# Patient Record
Sex: Female | Born: 1985 | Race: White | Hispanic: No | Marital: Single | Smoking: Current every day smoker
Health system: Southern US, Community
[De-identification: ages and names within clinical notes are randomized; demographics above are authoritative.]

## PROBLEM LIST (undated history)

## (undated) DIAGNOSIS — J939 Pneumothorax, unspecified: Secondary | ICD-10-CM

## (undated) DIAGNOSIS — M5090 Cervical disc disorder, unspecified, unspecified cervical region: Secondary | ICD-10-CM

## (undated) DIAGNOSIS — T7840XA Allergy, unspecified, initial encounter: Secondary | ICD-10-CM

## (undated) DIAGNOSIS — S32009A Unspecified fracture of unspecified lumbar vertebra, initial encounter for closed fracture: Secondary | ICD-10-CM

## (undated) DIAGNOSIS — F988 Other specified behavioral and emotional disorders with onset usually occurring in childhood and adolescence: Secondary | ICD-10-CM

## (undated) HISTORY — DX: Allergy, unspecified, initial encounter: T78.40XA

## (undated) HISTORY — PX: WISDOM TOOTH EXTRACTION: SHX21

## (undated) HISTORY — PX: PLEURAL SCARIFICATION: SHX748

## (undated) HISTORY — DX: Cervical disc disorder, unspecified, unspecified cervical region: M50.90

## (undated) HISTORY — DX: Unspecified fracture of unspecified lumbar vertebra, initial encounter for closed fracture: S32.009A

## (undated) HISTORY — DX: Other specified behavioral and emotional disorders with onset usually occurring in childhood and adolescence: F98.8

## (undated) HISTORY — DX: Pneumothorax, unspecified: J93.9

## (undated) HISTORY — PX: OTHER SURGICAL HISTORY: SHX169

---
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
## 2009-04-29 DIAGNOSIS — J939 Pneumothorax, unspecified: Secondary | ICD-10-CM

## 2009-04-29 HISTORY — PX: PLEURAL SCARIFICATION: SHX748

## 2009-04-29 HISTORY — DX: Pneumothorax, unspecified: J93.9

## 2009-05-25 ENCOUNTER — Ambulatory Visit: Payer: Self-pay | Admitting: Cardiothoracic Surgery

## 2009-05-25 ENCOUNTER — Inpatient Hospital Stay (HOSPITAL_COMMUNITY): Admission: EM | Admit: 2009-05-25 | Discharge: 2009-05-28 | Payer: Self-pay | Admitting: Emergency Medicine

## 2009-06-07 ENCOUNTER — Ambulatory Visit: Payer: Self-pay | Admitting: Cardiothoracic Surgery

## 2009-06-07 ENCOUNTER — Encounter: Admission: RE | Admit: 2009-06-07 | Discharge: 2009-06-07 | Payer: Self-pay | Admitting: Cardiothoracic Surgery

## 2009-06-09 ENCOUNTER — Ambulatory Visit: Payer: Self-pay | Admitting: Cardiothoracic Surgery

## 2009-06-09 ENCOUNTER — Encounter: Admission: RE | Admit: 2009-06-09 | Discharge: 2009-06-09 | Payer: Self-pay | Admitting: Cardiothoracic Surgery

## 2009-06-14 ENCOUNTER — Encounter: Admission: RE | Admit: 2009-06-14 | Discharge: 2009-06-14 | Payer: Self-pay | Admitting: Cardiothoracic Surgery

## 2009-06-14 ENCOUNTER — Ambulatory Visit: Payer: Self-pay | Admitting: Cardiothoracic Surgery

## 2010-09-11 NOTE — Assessment & Plan Note (Signed)
OFFICE VISIT   EZME, DUCH  DOB:  27-Oct-1985                                        June 09, 2009  CHART #:  [id number]   CURRENT PROBLEMS:  1. Spontaneous right pneumothorax (50%), treated with a chest tube.  2. History of smoking.   PRESENT ILLNESS:  The patient is a 25 year old Caucasian female who was  admitted in late January with a first episode of right spontaneous  pneumothorax.  This resolved with a chest tube.  Her first post hospital  office visit chest x-ray showed a 10% right pneumothorax.  She is  asymptomatic.  She still smokes 4-5 cigarettes a day, however.  She  returns now with a chest x-ray for followup of the small recurrent  pneumothorax.   PHYSICAL EXAMINATION:  Blood pressure 120/70, pulse 68, respirations 18,  saturation on room air 99%.  Breath sounds are clear and equal.  There  is no palpable crepitus.  The right chest tube site is healed.  Cardiac  rhythm is regular.   DIAGNOSTIC TESTS:  A PA and lateral chest x-ray shows the pneumothorax  to be decreased to approximately 5%.  There are no other abnormalities  seen on the lung fields.   PLAN:  The patient will return in 5 days with a chest x-ray.  She will  continue to have a low-level activity, and she was advised to completely  stop smoking.   Kerin Perna, M.D.  Electronically Signed   PV/MEDQ  D:  06/09/2009  T:  06/10/2009  Job:  [id number]

## 2010-09-11 NOTE — Assessment & Plan Note (Signed)
OFFICE VISIT   Whitney Little, Whitney Little  DOB:  1986/02/04                                        June 14, 2009  CHART #:  [id number]   CURRENT PROBLEMS:  1. Spontaneous right pneumothorax (50%) treated with chest tube,      May 25, 2009.  2. History of mild smoking.   PRESENT ILLNESS:  The patient is a 25 year old Caucasian female recently  reformed smoker who presents for final followup after having had a chest  tube placed for a right pneumothorax in late January.  Her followup  chest x-rays initially showed almost resolution of the pneumothorax,  then approximately 2 weeks after treatment she developed a small  recurrent 10% right pneumothorax.  This has been followed and treated  expectantly and she returns now with a chest x-ray for further review.   She is asymptomatic except for some mild shortness of breath while she  was shopping.  No chest pain.  The chest tube site is well healed.   PHYSICAL EXAMINATION:  Vital Signs:  Oxygen saturation is 99%, pulse is  74, and blood pressure is 120/70.  Lungs:  Breath sounds are clear  bilaterally.  Cardiac:  Rhythm is regular.   A PA and lateral chest x-ray today shows resolution of the pneumothorax.  There is no obvious bleb disease noted or other pulmonary parenchymal  abnormality.   PLAN:  The patient return as needed.  She understands the risk of a  recurrent right pneumothorax is approximately 1 in 5 and that she should  call our office during office hours,  report to the [facility] ER if she has recurrent symptoms.  She was also  advised on the importance of permanent smoking cessation.  No  prescriptions were provided.   Kerin Perna, M.D.  Electronically Signed   PV/MEDQ  D:  06/14/2009  T:  06/14/2009  Job:  [id number]

## 2010-09-11 NOTE — Assessment & Plan Note (Signed)
OFFICE VISIT   Whitney Little, Whitney Little  DOB:  [birth year]                                        June 07, 2009  CHART #:  [id number]   CURRENT PROBLEMS:  1. Right spontaneous pneumothorax, treated with a chest tube, May 25, 2009.  2. History of smoking.   PRESENT ILLNESS:  The patient is a 25 year old Caucasian female who had  a spontaneous right pneumothorax in late January who was treated with a  chest tube.  Her lung re-expand and the chest tube was removed after  approximately 48 hours.  She has done well at home and has limited her  activities.  She has not done any strenuous lifting or coughing.  She  continues to smoke approximately half a pack a day.  She returns for a  surgical follow up and surveillance x-ray.   PHYSICAL EXAMINATION:  Blood pressure 120/80, pulse 60, respirations 18,  saturation 99%.  The chest tube site is well healed.  Breath sounds are  clear and equal.  Cardiac rhythm is regular.   DIAGNOSTIC TESTS:  PA and lateral chest x-ray today shows a 10% right  pneumothorax.  This is increased from her x-ray prior to discharge from  the hospital.   PLAN:  She has a small recurrent right pneumothorax.  I feel this will  probably gradually resolve.  It has been 9 days since her last x-ray and  she has been asymptomatic.  We will need to continue to follow this with  serial chest x-rays.  I have allowed her to go back to work on a  limited basis to avoid heavy lifting or strenuous activity.  She was  given a prescription for some hydrocodone 5 mg p.r.n. and will return  with a followup chest x-ray on June 09, 2009.   Kerin Perna, M.D.  Electronically Signed   PV/MEDQ  D:  06/07/2009  T:  06/08/2009  Job:  [id number]

## 2010-09-14 NOTE — Op Note (Signed)
[place]  Patient:    Whitney Little, Whitney Little                    MRN: [id number] Proc. Date: 06/20/99 Adm. Date:  [id number] Attending:  Colbert Ewing                           Operative Report  PREOPERATIVE DIAGNOSIS:  Right elbow displaced medial epicondyle fracture. Status post posterior dislocation, right elbow, closed, status post closed reduction.   POSTOPERATIVE DIAGNOSIS:  Right elbow displaced medial epicondyle fracture. Status post posterior dislocation, right elbow, closed, status post closed reduction.  OPERATION PERFORMED:  Right elbow examination under anesthesia followed by open  reduction internal fixation of medial epicondyle fracture utilizing cannulated .5 lag screw x 1.  SURGEON:  Loreta Ave, M.D.  ASSISTANT:  Arlys John D. Petrarca, P.A.-C.  ANESTHESIA:  General.  ESTIMATED BLOOD LOSS:  Minimal.  TOURNIQUET TIME:  40 minutes.  SPECIMENS:  None.  CULTURES:  None.  COMPLICATIONS:  None.  DRESSING:  Soft compressive with long arm splint and bulky padded dressing.  DESCRIPTION OF PROCEDURE:  Patient was brought to the operating room and after adequate anesthesia had been obtained, splint removed from the right arm. Fluoroscopy unit used for guidance.  Persistent hematoma medially but overall swelling better.  Tourniquet applied about the upper aspect of the right arm, prepped and draped in the usual sterile fashion.  I could bring the arm through  reasonable motion.  There was obvious gross medial instability from the medial epicondyle fracture.  Exsanguinated with elevation and Esmarch.  Tourniquet inflated to 200 mmHg.  Curved incision over the medial epicondyle.  Skin and subcutaneous tissues divided.  Large subcutaneous hematoma present, completely evacuated.  Ulnar nerve identified and protected throughout the procedure. Medial epicondylar fragment was brought back up into anatomic position with  good position confirmed with fluoroscopy.  Held in place with a towel clip.  Temporarily pinned with the guide wire for the 3.5 screw which was placed into the distal humerus avoiding the olecranon fossa.  Measured for a 28 mm screw which was predrilled nd then placed with a screw being a lag screw to allow compression.  It was firmly  tightened down which brought the epicondyle fragment into anatomic position. At completion I had nice firm fixation with anatomic position of the fracture clinically and by C-arm.  I could bring the arm and elbow through full motion including full motion of the forearm.  Marked improvement of elbow stability after repair.  No restriction of motion at all.  The wound was irrigated. Subcutaneous tissue closed with Vicryl, skin with nylon.  Margins of the wound injected with  Marcaine.  Prior to closure, the ulnar nerve was once again identified and was n good condition.  After the wound was closed, sterile compressive dressing and long arm splint applied.  Tourniquet inflated.  Anesthesia reversed.  Brought to recovery room.  Tolerated surgery well.  No complications. DD:  06/20/99 TD:  06/20/99 Job: [id number]

## 2010-11-14 IMAGING — CR DG CHEST 2V
2 series · 2 of 2 positions shown · non-contrast
Comparison: 06/07/2009

CLINICAL DATA: Right pneumothorax, follow-up

CHEST - 2 VIEW

[w chest pa]
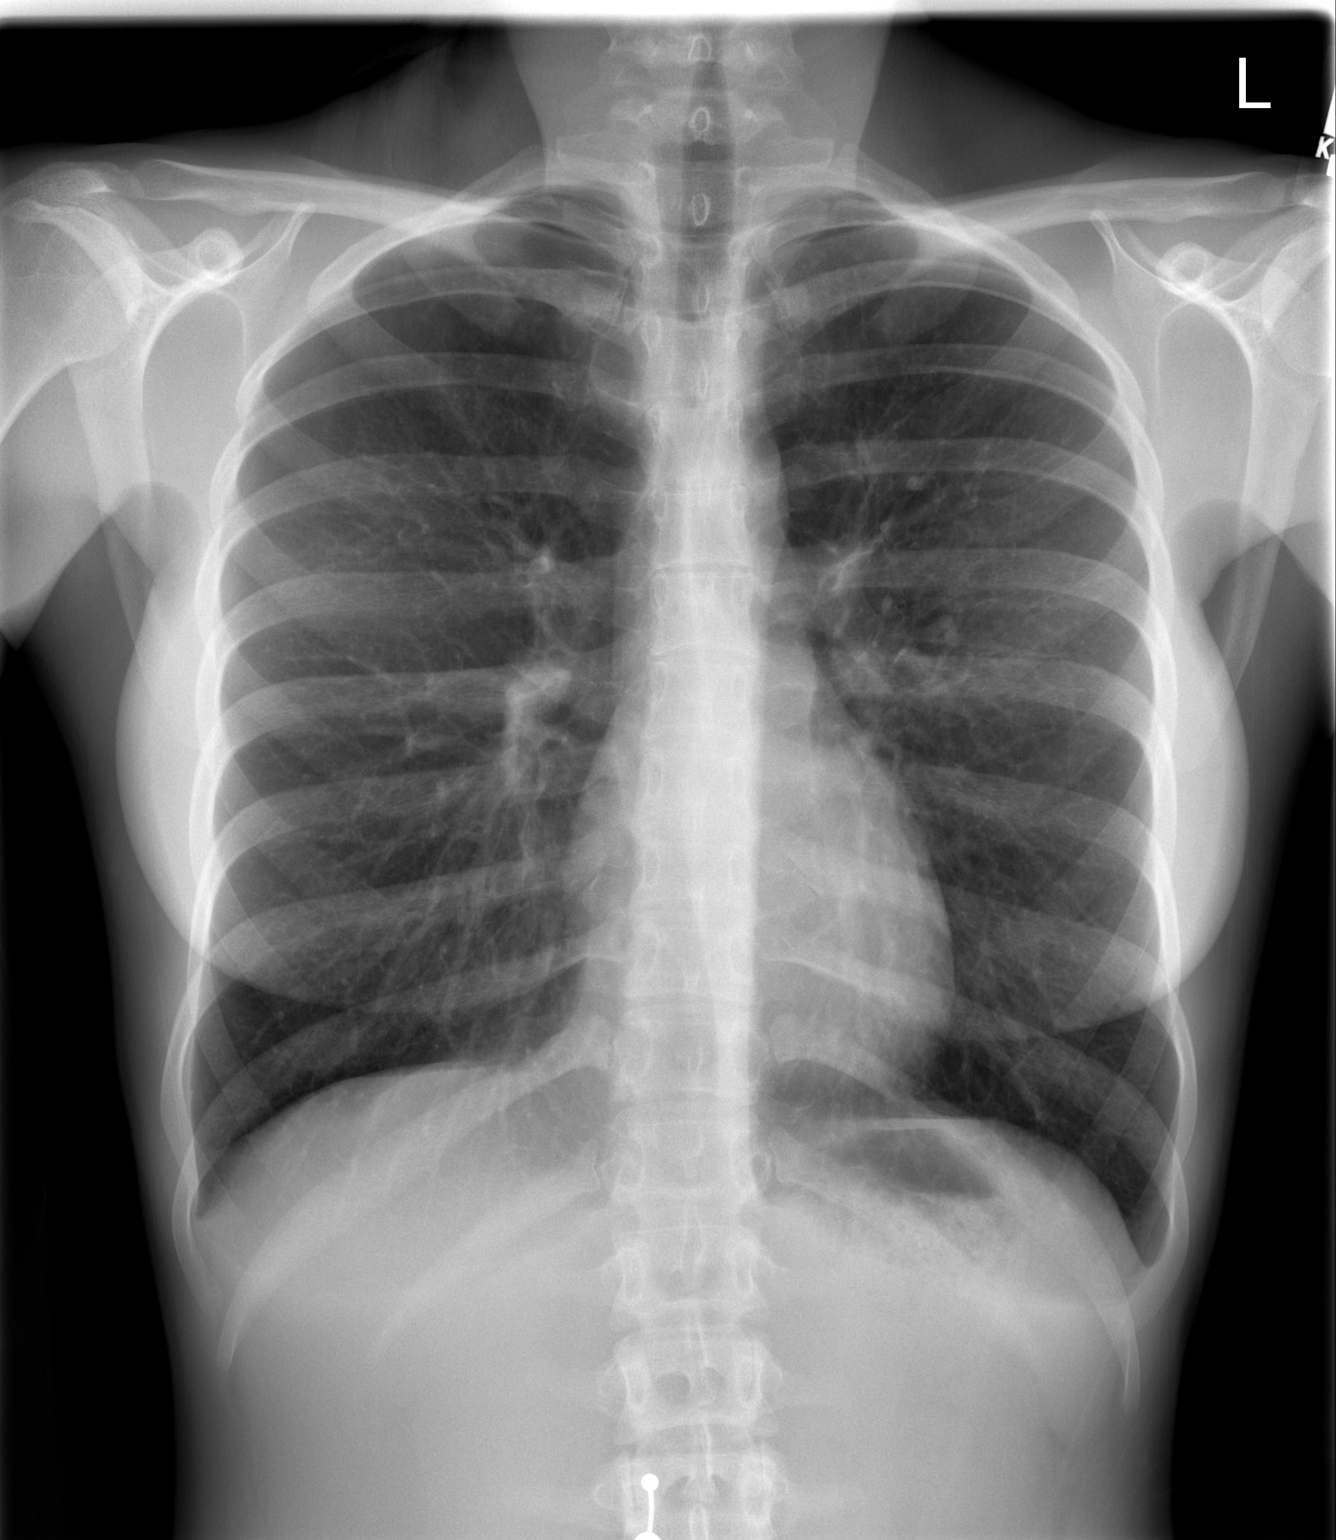

[w chest lat]
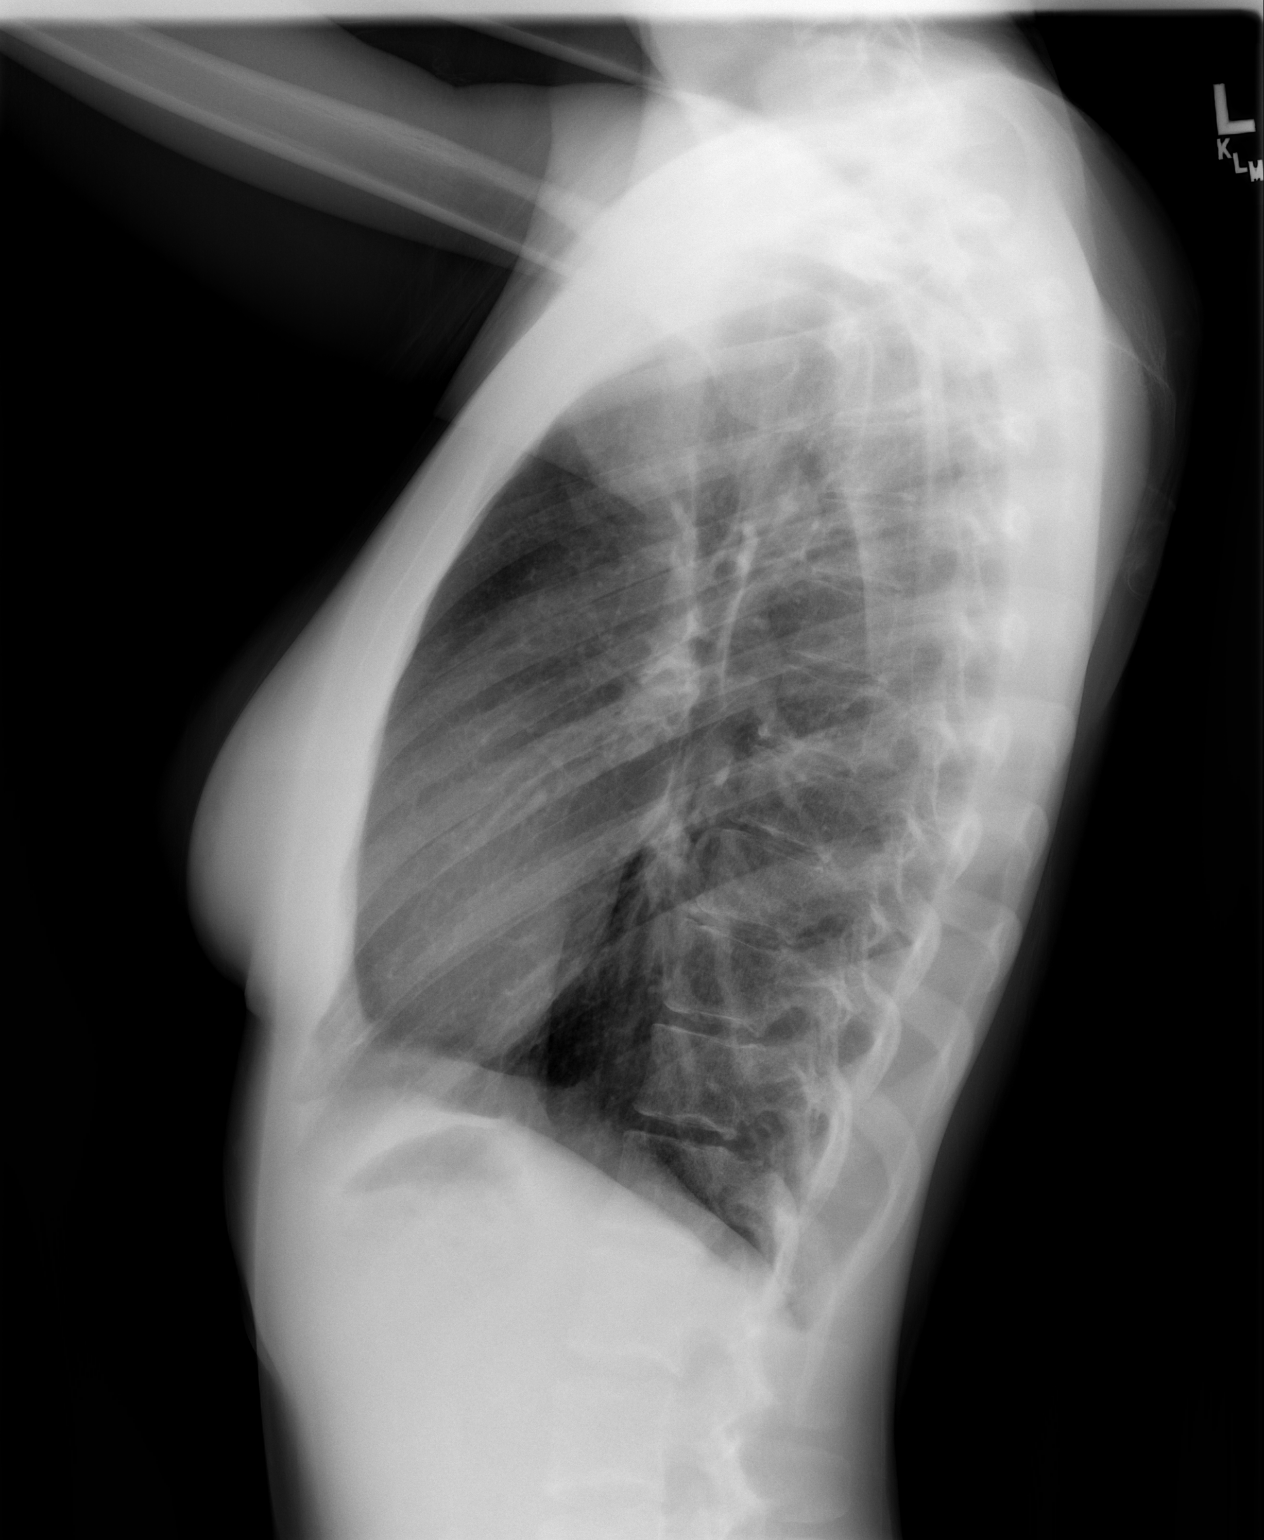

[2 of 2 positions shown; findings below may reference images not displayed]

FINDINGS: Interval improvement in the right apical pneumothorax,
estimated at less than 10%.  Mild bronchial thickening centrally.
Normal heart size and vascularity.  No consolidation or edema.  No
pneumonia.
IMPRESSION: Improving small right apical pneumothorax

## 2011-01-03 NOTE — H&P (Signed)
Whitney Little  DICTATION # [id number] CSN# [id number]   Meriel Pica, MD 01/03/2011 9:41 AM

## 2011-01-04 ENCOUNTER — Encounter (HOSPITAL_COMMUNITY): Payer: Self-pay | Admitting: *Deleted

## 2011-01-07 ENCOUNTER — Encounter (HOSPITAL_COMMUNITY): Payer: Self-pay | Admitting: *Deleted

## 2011-01-07 ENCOUNTER — Encounter (HOSPITAL_COMMUNITY)
Admission: RE | Disposition: A | Discharge status: Home / Self Care | Payer: Self-pay | Source: Ambulatory Visit | Attending: Obstetrics and Gynecology

## 2011-01-07 ENCOUNTER — Ambulatory Visit (HOSPITAL_COMMUNITY): Payer: PRIVATE HEALTH INSURANCE | Admitting: Anesthesiology

## 2011-01-07 ENCOUNTER — Other Ambulatory Visit: Payer: Self-pay | Admitting: Obstetrics and Gynecology

## 2011-01-07 ENCOUNTER — Encounter (HOSPITAL_COMMUNITY): Payer: Self-pay | Admitting: Anesthesiology

## 2011-01-07 ENCOUNTER — Ambulatory Visit (HOSPITAL_COMMUNITY)
Admission: RE | Admit: 2011-01-07 | Discharge: 2011-01-07 | Disposition: A | Discharge status: Home / Self Care | Payer: PRIVATE HEALTH INSURANCE | Source: Ambulatory Visit | Attending: Obstetrics and Gynecology | Admitting: Obstetrics and Gynecology

## 2011-01-07 DIAGNOSIS — O021 Missed abortion: Secondary | ICD-10-CM | POA: Insufficient documentation

## 2011-01-07 HISTORY — PX: DILATION AND EVACUATION: SHX1459

## 2011-01-07 LAB — CBC
MCH: 32.1 pg (ref 26.0–34.0)
MCHC: 34.3 g/dL (ref 30.0–36.0)
Platelets: 238 10*3/uL (ref 150–400)

## 2011-01-07 LAB — ABO/RH: ABO/RH(D): A POS

## 2011-01-07 SURGERY — DILATION AND EVACUATION, UTERUS
Anesthesia: Monitor Anesthesia Care | Site: Vagina | Wound class: Clean Contaminated

## 2011-01-07 MED ORDER — MIDAZOLAM HCL 5 MG/5ML IJ SOLN
INTRAMUSCULAR | Status: DC | PRN
  Administered 2011-01-07: 2 mg via INTRAVENOUS

## 2011-01-07 MED ORDER — DEXAMETHASONE SODIUM PHOSPHATE 4 MG/ML IJ SOLN
INTRAMUSCULAR | Status: DC | PRN
  Administered 2011-01-07: 10 mg via INTRAVENOUS

## 2011-01-07 MED ORDER — CEFAZOLIN SODIUM 1-5 GM-% IV SOLN
1.0000 g | INTRAVENOUS | Status: AC
  Administered 2011-01-07: 1 g via INTRAVENOUS

## 2011-01-07 MED ORDER — PROPOFOL 10 MG/ML IV EMUL
INTRAVENOUS | Status: DC | PRN
  Administered 2011-01-07: 80 mg via INTRAVENOUS
  Administered 2011-01-07: 50 mg via INTRAVENOUS
  Administered 2011-01-07: 30 mg via INTRAVENOUS

## 2011-01-07 MED ORDER — FENTANYL CITRATE 0.05 MG/ML IJ SOLN
25.0000 ug | INTRAMUSCULAR | Status: DC | PRN
  Administered 2011-01-07: 50 ug via INTRAVENOUS

## 2011-01-07 MED ORDER — FENTANYL CITRATE 0.05 MG/ML IJ SOLN
INTRAMUSCULAR | Status: AC
Start: 2011-01-07 — End: 2011-01-07
  Filled 2011-01-07: qty 2

## 2011-01-07 MED ORDER — DEXAMETHASONE SODIUM PHOSPHATE 10 MG/ML IJ SOLN
INTRAMUSCULAR | Status: AC
  Filled 2011-01-07: qty 1

## 2011-01-07 MED ORDER — PROPOFOL 10 MG/ML IV EMUL
INTRAVENOUS | Status: AC
  Filled 2011-01-07: qty 20

## 2011-01-07 MED ORDER — SCOPOLAMINE 1 MG/3DAYS TD PT72
MEDICATED_PATCH | TRANSDERMAL | Status: AC
  Filled 2011-01-07: qty 1

## 2011-01-07 MED ORDER — FENTANYL CITRATE 0.05 MG/ML IJ SOLN
INTRAMUSCULAR | Status: AC
  Filled 2011-01-07: qty 2

## 2011-01-07 MED ORDER — ONDANSETRON HCL 4 MG/2ML IJ SOLN
INTRAMUSCULAR | Status: DC | PRN
  Administered 2011-01-07: 4 mg via INTRAVENOUS

## 2011-01-07 MED ORDER — KETOROLAC TROMETHAMINE 30 MG/ML IJ SOLN: 15.0000 mg | Freq: Once | INTRAMUSCULAR | Status: DC | PRN

## 2011-01-07 MED ORDER — LACTATED RINGERS IV SOLN
INTRAVENOUS | Status: DC
Start: 2011-01-07 — End: 2011-01-07
  Administered 2011-01-07 (×2): via INTRAVENOUS

## 2011-01-07 MED ORDER — LIDOCAINE HCL (CARDIAC) 20 MG/ML IV SOLN
INTRAVENOUS | Status: DC | PRN
  Administered 2011-01-07: 60 mg via INTRAVENOUS

## 2011-01-07 MED ORDER — LIDOCAINE HCL 1 % IJ SOLN
INTRAMUSCULAR | Status: DC | PRN
  Administered 2011-01-07: 8 mL

## 2011-01-07 MED ORDER — KETOROLAC TROMETHAMINE 30 MG/ML IJ SOLN
INTRAMUSCULAR | Status: AC
  Filled 2011-01-07: qty 1

## 2011-01-07 MED ORDER — LIDOCAINE HCL (CARDIAC) 20 MG/ML IV SOLN
INTRAVENOUS | Status: AC
  Filled 2011-01-07: qty 5

## 2011-01-07 MED ORDER — MIDAZOLAM HCL 2 MG/2ML IJ SOLN
INTRAMUSCULAR | Status: AC
  Filled 2011-01-07: qty 2

## 2011-01-07 MED ORDER — SCOPOLAMINE 1 MG/3DAYS TD PT72
1.0000 | MEDICATED_PATCH | Freq: Once | TRANSDERMAL | Status: DC
  Administered 2011-01-07: 1.5 mg via TRANSDERMAL

## 2011-01-07 MED ORDER — CEFAZOLIN SODIUM 1-5 GM-% IV SOLN
INTRAVENOUS | Status: AC
  Filled 2011-01-07: qty 50

## 2011-01-07 MED ORDER — ONDANSETRON HCL 4 MG/2ML IJ SOLN
INTRAMUSCULAR | Status: AC
  Filled 2011-01-07: qty 2

## 2011-01-07 MED ORDER — FENTANYL CITRATE 0.05 MG/ML IJ SOLN
INTRAMUSCULAR | Status: DC | PRN
  Administered 2011-01-07: 100 ug via INTRAVENOUS
  Administered 2011-01-07: 50 ug via INTRAVENOUS

## 2011-01-07 MED ORDER — OXYCODONE-ACETAMINOPHEN 5-325 MG PO TABS: 1.0000 | ORAL_TABLET | ORAL | Status: AC | PRN

## 2011-01-07 SURGICAL SUPPLY — 17 items
CATH ROBINSON RED A/P 16FR (CATHETERS) ×2 IMPLANT
CLOTH BEACON ORANGE TIMEOUT ST (SAFETY) ×2 IMPLANT
DECANTER SPIKE VIAL GLASS SM (MISCELLANEOUS) ×2 IMPLANT
DRAPE UTILITY XL STRL (DRAPES) ×2 IMPLANT
GLOVE BIO SURGEON STRL SZ7 (GLOVE) ×4 IMPLANT
KIT BERKELEY 1ST TRIMESTER 3/8 (MISCELLANEOUS) ×2 IMPLANT
NEEDLE SPNL 22GX3.5 QUINCKE BK (NEEDLE) ×2 IMPLANT
NS IRRIG 1000ML POUR BTL (IV SOLUTION) ×2 IMPLANT
PACK VAGINAL MINOR WOMEN LF (CUSTOM PROCEDURE TRAY) ×2 IMPLANT
PAD PREP 24X48 CUFFED NSTRL (MISCELLANEOUS) ×2 IMPLANT
SET BERKELEY SUCTION TUBING (SUCTIONS) ×2 IMPLANT
SYR CONTROL 10ML LL (SYRINGE) ×2 IMPLANT
TOWEL OR 17X24 6PK STRL BLUE (TOWEL DISPOSABLE) ×4 IMPLANT
VACURETTE 10 RIGID CVD (CANNULA) IMPLANT
VACURETTE 7MM CVD STRL WRAP (CANNULA) ×2 IMPLANT
VACURETTE 8 RIGID CVD (CANNULA) IMPLANT
VACURETTE 9 RIGID CVD (CANNULA) IMPLANT

## 2011-01-07 NOTE — Op Note (Signed)
Preoperative diagnosis: Missed AB Postoperative diagnosis: Same  Procedure: Dilatation and evacuation  Surgeon: Marcelle Overlie  EBL: Minimal  Specimens removed: Products of conception, to pathology.  Procedure and findings:  Patient to the operative room after an adequate level of MAC was obtained with the patient legs in stirrups the perineum and vagina were prepped and draped in the usual manner for D&E, the bladder was drained he UA carried out uterus was 7-8 weeks size mid position adnexa negative. Speculum was positioned, cervix grasped with tenaculum paracervical block was then created infiltrating at 3 and 9:00 submucosally, 7 cc of 1% plain Xylocaine at each site after negative aspiration. The uterus was then sounded to 8 cm, progressively dilated to a 27 Pratt dilator a 7 curved curet was then used to curette a moderate amount of tissue. When no further tissue could be removed a medium blunt curette was used to explore the cavity revealing the walls to be clean minimal bleeding. She tolerated this well went to recovery in good condition. She did receive Toradol IV at the end of the procedure. Dictated by [facility] [employer], not proofread [name]. Whitney Little.D.

## 2011-01-07 NOTE — Anesthesia Postprocedure Evaluation (Signed)
Anesthesia Post Note  Patient: Whitney Little  Procedure(s) Performed:  DILATATION AND EVACUATION (D&E)  Anesthesia type: MAC  Patient location: PACU  Post pain: Pain level controlled  Post assessment: Post-op Vital signs reviewed  Last Vitals:  Filed Vitals:   01/07/11 1448  BP:   Pulse: 77  Temp: 98.2 F (36.8 C)  Resp: 20    Post vital signs: Reviewed  Level of consciousness: sedated  Complications: No apparent anesthesia complications

## 2011-01-07 NOTE — Transfer of Care (Signed)
Immediate Anesthesia Transfer of Care Note  Patient: Whitney Little  Procedure(s) Performed:  DILATATION AND EVACUATION (D&E)  Patient Location: PACU  Anesthesia Type: MAC  Level of Consciousness: awake, alert  and oriented  Airway & Oxygen Therapy: Patient Spontanous Breathing and Patient connected to nasal cannula oxygen  Post-op Assessment: Report given to PACU RN and Post -op Vital signs reviewed and stable  Post vital signs: Reviewed and stable  Complications: No apparent anesthesia complications

## 2011-01-07 NOTE — Anesthesia Procedure Notes (Signed)
Procedure Name: MAC Performed by: Carlyle Lipa Pre-anesthesia Checklist: Patient identified, Patient being monitored, Suction available and Timeout performed Patient Re-evaluated:Patient Re-evaluated prior to inductionOxygen Delivery Method: Simple face mask Preoxygenation: Pre-oxygenation with 100% oxygen Intubation Type: IV induction Dental Injury: Teeth and Oropharynx as per pre-operative assessment

## 2011-01-07 NOTE — Anesthesia Preprocedure Evaluation (Signed)
Anesthesia Evaluation  Name, MR# and DOB Patient awake  General Assessment Comment  Reviewed: Allergy & Precautions, H&P , NPO status , Patient's Chart, lab work & pertinent test results, reviewed documented beta blocker date and time   History of Anesthesia Complications (+) PONV  Airway Mallampati: I TM Distance: >3 FB Neck ROM: full    Dental  (+) Teeth Intact   Pulmonary  clear to auscultation  breath sounds clear to auscultation none    Cardiovascular regular Normal+ Systolic murmurs    Neuro/Psych Negative Neurological ROS  Negative Psych ROS  GI/Hepatic/Renal negative GI ROS  negative Liver ROS  negative Renal ROS        Endo/Other  Negative Endocrine ROS (+)      Abdominal   Musculoskeletal   Hematology negative hematology ROS (+)   Peds  Reproductive/Obstetrics (+) Pregnancy (missed ab)    Anesthesia Other Findings             Anesthesia Physical Anesthesia Plan  ASA: II  Anesthesia Plan: MAC   Post-op Pain Management:    Induction:   Airway Management Planned:   Additional Equipment:   Intra-op Plan:   Post-operative Plan:   Informed Consent: I have reviewed the patients History and Physical, chart, labs and discussed the procedure including the risks, benefits and alternatives for the proposed anesthesia with the patient or authorized representative who has indicated his/her understanding and acceptance.   Dental Advisory Given  Plan Discussed with: CRNA and Surgeon  Anesthesia Plan Comments:         Anesthesia Quick Evaluation

## 2011-01-15 ENCOUNTER — Encounter (HOSPITAL_COMMUNITY): Payer: Self-pay | Admitting: Obstetrics and Gynecology

## 2011-01-28 DEATH — deceased

## 2011-04-14 ENCOUNTER — Ambulatory Visit: Payer: Self-pay

## 2011-04-14 DIAGNOSIS — M62838 Other muscle spasm: Secondary | ICD-10-CM

## 2011-04-14 DIAGNOSIS — M25519 Pain in unspecified shoulder: Secondary | ICD-10-CM

## 2011-04-14 DIAGNOSIS — M542 Cervicalgia: Secondary | ICD-10-CM

## 2011-11-28 ENCOUNTER — Ambulatory Visit (INDEPENDENT_AMBULATORY_CARE_PROVIDER_SITE_OTHER): Payer: BC Managed Care – PPO | Admitting: Emergency Medicine

## 2011-11-28 VITALS — BP 92/70 | HR 77 | Temp 98.1°F | Resp 16 | Ht 64.5 in | Wt 100.0 lb

## 2011-11-28 DIAGNOSIS — L980 Pyogenic granuloma: Secondary | ICD-10-CM

## 2011-11-28 DIAGNOSIS — F988 Other specified behavioral and emotional disorders with onset usually occurring in childhood and adolescence: Secondary | ICD-10-CM

## 2011-11-28 DIAGNOSIS — IMO0002 Reserved for concepts with insufficient information to code with codable children: Secondary | ICD-10-CM

## 2011-11-28 DIAGNOSIS — M79609 Pain in unspecified limb: Secondary | ICD-10-CM

## 2011-11-28 MED ORDER — AMPHETAMINE-DEXTROAMPHETAMINE 20 MG PO TABS: 20.0000 mg | ORAL_TABLET | Freq: Two times a day (BID) | ORAL | Status: DC

## 2011-11-28 MED ORDER — TRAMADOL HCL 50 MG PO TABS: 50.0000 mg | ORAL_TABLET | Freq: Three times a day (TID) | ORAL | Status: AC | PRN

## 2011-11-28 MED ORDER — CEPHALEXIN 500 MG PO TABS: 500.0000 mg | ORAL_TABLET | Freq: Four times a day (QID) | ORAL | Status: DC

## 2011-11-28 MED ORDER — CEPHALEXIN 500 MG PO TABS: 500.0000 mg | ORAL_TABLET | Freq: Three times a day (TID) | ORAL | Status: AC

## 2011-11-28 NOTE — Progress Notes (Signed)
Patient ID: Whitney Little MRN: [id number], DOB: [birth year], 26 y.o. Date of Encounter: 11/28/2011, 10:05 AM  Patient also requesting refill of Adderall.  Last OV was in March 2012 due to she has not had health insurance.  States she has been on Adderall since Kindergarten and is controlled at current dose.   PROCEDURE NOTE: Verbal consent obtained. Sterile technique employed. Numbing: Digital block with 2% lidocaine plain. Betadine prep.  Pyogenic granuloma removed with curette.   Hemostasis obtained. Wound cleansed and dressed.  Wound care instructions including precautions covered with patient. Handout given.    Rhoderick Moody, PA-C 11/28/2011 10:05 AM

## 2011-11-28 NOTE — Patient Instructions (Signed)
WOUND CARE  . Keep area clean and dry for 24 hours. Do not remove bandage, if applied. . After 24 hours, remove bandage and wash wound gently with mild soap and warm water. Reapply a new bandage after cleaning wound, if directed. . Continue daily cleansing with soap and water until stitches/staples are removed. . Do not apply any ointments or creams to the wound while stitches/staples are in place, as this may cause delayed healing. . Notify the office if you experience any of the following signs of infection: Swelling, redness, pus drainage, streaking, fever >101.0 F . Notify the office if you experience excessive bleeding that does not stop after 15-20 minutes of constant, firm pressure.   

## 2011-11-28 NOTE — Progress Notes (Signed)
Patient presents with cut from two weeks ago. Been putting peroxide and neosporin on left thumb.  Cut  cuticle on right thumb.  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  Had a tdap last year.  She has developed a growth at base of the nail.

## 2011-11-28 NOTE — Progress Notes (Signed)
  Subjective:    Patient ID: Whitney Little, female    DOB: 07/08/1985, 26 y.o.   MRN: [id number]  HPI  please see note as dictated by Valetta Mole    Review of Systems patient in good health she is on oral contraceptive pills      Objective:   Physical Exam examination of her right thigh reveals an area of granulation tissue which is 6 x 8 mm involving the corner of the nail. The tissue around the area of granuloma is also macerated with whitish skin        Assessment & Plan:  We'll go ahead and number some removed the area of granulation tissue and treat with cephalexin 500 twice a day for one week.

## 2012-03-30 ENCOUNTER — Ambulatory Visit (INDEPENDENT_AMBULATORY_CARE_PROVIDER_SITE_OTHER): Payer: BC Managed Care – PPO | Admitting: Family Medicine

## 2012-03-30 VITALS — BP 112/66 | HR 81 | Temp 97.9°F | Resp 16 | Ht 63.5 in | Wt 107.0 lb

## 2012-03-30 DIAGNOSIS — F988 Other specified behavioral and emotional disorders with onset usually occurring in childhood and adolescence: Secondary | ICD-10-CM

## 2012-03-30 DIAGNOSIS — F909 Attention-deficit hyperactivity disorder, unspecified type: Secondary | ICD-10-CM

## 2012-03-30 DIAGNOSIS — R61 Generalized hyperhidrosis: Secondary | ICD-10-CM

## 2012-03-30 DIAGNOSIS — N926 Irregular menstruation, unspecified: Secondary | ICD-10-CM

## 2012-03-30 DIAGNOSIS — G4700 Insomnia, unspecified: Secondary | ICD-10-CM

## 2012-03-30 LAB — POCT CBC
Granulocyte percent: 65.4 %G (ref 37–80)
HCT, POC: 43.2 % (ref 37.7–47.9)
MCV: 99.2 fL — AB (ref 80–97)
MID (cbc): 0.7 (ref 0–0.9)
Platelet Count, POC: 359 10*3/uL (ref 142–424)
RBC: 4.35 M/uL (ref 4.04–5.48)

## 2012-03-30 LAB — POCT URINE PREGNANCY: Preg Test, Ur: NEGATIVE

## 2012-03-30 MED ORDER — AMPHETAMINE-DEXTROAMPHETAMINE 20 MG PO TABS: 20.0000 mg | ORAL_TABLET | Freq: Two times a day (BID) | ORAL | Status: DC

## 2012-03-30 NOTE — Progress Notes (Signed)
[facility] [address] [place] [address] [phone]  Date:  03/30/2012   Name:  Whitney Little   DOB:  May 29, 1985   MRN:  [id number]  PCP:  No primary provider on file.    Chief Complaint: Insomnia, Medication Refill and menstrul issues   History of Present Illness:  Whitney Little is a 26 y.o. very pleasant female patient who presents with the following:  She is here today with a few different issues.   Whitney Little has been bothered by insomnia for some time. She is able to get to sleep, but then she will wake up during the night with "hot flashes" and have a hard time getting back to sleep. She has noted this problem for over 6 months.  She wakes up at 2 or 3 am and sometimes cannot get back to sleep at all.    She has tried unisom and other OTC sleep aids - they have not seemed to help  She needs a refill of her adderall.  She takes it once or twice a day.  Just taking one pill does not improve her sleep- there are days where she takes none and this also does not help.  She tries to never take it after 5 pm.    She has been stable on her adderall since she was a child.   [facility]   She also came off her OCP about 6 months ago.  She is not sure if her sleeping problems predate stopping her OCP or not.    Last month she had "3 periods."  She noted heavy bleeding and bleeding durationsof 7 days.  She was fairly regular after she stopped her pills prior to this past month. However, she went on OCP in the first place due to heavy and irregular menses.    He last pap was in march 2012.   Last refill of adderall in August- she does not use it every day.    Her mother has a history of hypothyroidism.    She and her BF are actively trying to conceive.  She is on prenatal vitamins but is still smoking. She has cut back to a 1/2 PPD   Patient Active Problem List  Diagnosis  . ADD (attention  deficit disorder)    Past Medical History  Diagnosis Date  . ADD (attention deficit disorder)     Past Surgical History  Procedure Date  . Right arm   . Pleural scarification   . Right foot surgery   . Wisdom tooth extraction   . Dilation and evacuation 01/07/2011    Procedure: DILATATION AND EVACUATION (D&E);  Surgeon: Meriel Pica;  Location: [facility];  Service: Gynecology;  Laterality: N/A;    History  Substance Use Topics  . Smoking status: Current Every Day Smoker -- 0.5 packs/day for 9 years    Types: Cigarettes  . Smokeless tobacco: Never Used  . Alcohol Use: No    Family History  Problem Relation Age of Onset  . Osteoporosis    . Hypertension    . Heart disease    . Thyroid disease    . Hypertension Father     No Known Allergies  Medication list has been reviewed and updated.  Current Outpatient Prescriptions on File Prior to Visit  Medication Sig Dispense Refill  . amphetamine-dextroamphetamine (ADDERALL) 20 MG tablet Take 1 tablet (20 mg total) by  mouth 2 (two) times daily.  60 tablet  0    Review of Systems:  As per HPI- otherwise negative.   Physical Examination: Filed Vitals:   03/30/12 1244  BP: 112/66  Pulse: 81  Temp: 97.9 F (36.6 C)  Resp: 16   Filed Vitals:   03/30/12 1244  Height: 5' 3.5" (1.613 m)  Weight: 107 lb (48.535 kg)   Body mass index is 18.66 kg/(m^2). Ideal Body Weight: Weight in (lb) to have BMI = 25: 143.1   GEN: WDWN, NAD, Non-toxic, A & O x 3 HEENT: Atraumatic, Normocephalic. Neck supple. No masses, No LAD. Ears and Nose: No external deformity. CV: RRR, No M/G/R. No JVD. No thrill. No extra heart sounds. PULM: CTA B, no wheezes, crackles, rhonchi. No retractions. No resp. distress. No accessory muscle use. ABD: S, NT, ND. No rebound. No HSM. EXTR: No c/c/e NEURO Normal gait.  PSYCH: Normally interactive. Conversant. Not depressed or anxious appearing.  Calm demeanor.  GU: normal exam.  No lesions, no  discharge, cervix visually normal, no adnexal masses or tenderness   Results for orders placed in visit on 03/30/12  POCT CBC      Component Value Range   WBC 10.5 (*) 4.6 - 10.2 K/uL   Lymph, poc 3.0  0.6 - 3.4   POC LYMPH PERCENT 28.4  10 - 50 %L   MID (cbc) 0.7  0 - 0.9   POC MID % 6.2  0 - 12 %M   POC Granulocyte 6.9  2 - 6.9   Granulocyte percent 65.4  37 - 80 %G   RBC 4.35  4.04 - 5.48 M/uL   Hemoglobin 13.2  12.2 - 16.2 g/dL   HCT, POC 40.9  81.1 - 47.9 %   MCV 99.2 (*) 80 - 97 fL   MCH, POC 30.3  27 - 31.2 pg   MCHC 30.6 (*) 31.8 - 35.4 g/dL   RDW, POC 91.4     Platelet Count, POC 359  142 - 424 K/uL   MPV 10.1  0 - 99.8 fL  POCT URINE PREGNANCY      Component Value Range   Preg Test, Ur Negative      Assessment and Plan: 1. Insomnia    2. Night sweats  POCT CBC, TSH  3. Menstrual irregularity  POCT urine pregnancy  4. ADHD (attention deficit hyperactivity disorder)    5. ADD (attention deficit disorder)  amphetamine-dextroamphetamine (ADDERALL) 20 MG tablet, amphetamine-dextroamphetamine (ADDERALL) 20 MG tablet   Whitney Little is here with several issues today.  It is possible that her night sweats and menstrual irregularities are related.  Will check a TSH.  If this is normal plan to refer her to OBG for further evaluation.  She states that she is not concerned about STI as she has been with the same partner for some time.  She also has not had any hemoptysis or chronic cough to suggest TB. Her insomnia seems to be a function of her hot flashes/ night sweats.  She is interested in an rx for [place] or similar.  However, she is willing to at least wait until we receive her TSH results prior to trying one of these medications.  Counseled her that if she is serious about becoming pregnant right now we would be better off not adding another potentially detrimental rx to her regimen.    Refilled her adderall- gave #60 with one RF  Will plan further follow- up pending  labs.  Encouraged her to quit smoking- she is trying.     Abbe Amsterdam, MD

## 2012-03-30 NOTE — Patient Instructions (Addendum)
Please work hard on quitting smoking!  I will be in touch regarding your labs tomorrow or the next day and we will make our plan from there.

## 2012-04-01 ENCOUNTER — Telehealth: Payer: Self-pay

## 2012-04-01 DIAGNOSIS — N926 Irregular menstruation, unspecified: Secondary | ICD-10-CM

## 2012-04-01 MED ORDER — ZOLPIDEM TARTRATE 10 MG PO TABS: ORAL_TABLET | ORAL | Status: DC

## 2012-04-01 NOTE — Telephone Encounter (Signed)
Spoke with Whitney Little and advised lab results, Whitney Little really would like Dr Patsy Lager to call her back ASAP. She would like to get something called in for sleep and her referral. Please call Whitney Little back

## 2012-04-01 NOTE — Telephone Encounter (Signed)
Called her and discussed her insomnia.  TSH is normal.  She would like to try [place]- she has not tried this medication in the past.  Advised her to take it and go right to bed, to take only when she has 8 hours before she needs to get up, and not to take it every day to decrease risk of habit formation.    She would also like to be referred to OBG- will take care of this for her as well.    Advised her that Remus Loffler is a pregnancy category C, so she should stop it once she becomes pregnant.  Advised her that she might not even want to start [place] as she would like to become pregnant soon, but she is very concerned about her insomnia and would like to try it. Advised her to take a 1/2 tablet first as this will likely be adequate for her small body size

## 2012-09-11 ENCOUNTER — Telehealth: Payer: Self-pay | Admitting: Family Medicine

## 2012-09-11 ENCOUNTER — Ambulatory Visit (INDEPENDENT_AMBULATORY_CARE_PROVIDER_SITE_OTHER): Payer: BC Managed Care – PPO | Admitting: Family Medicine

## 2012-09-11 VITALS — BP 114/68 | HR 110 | Temp 98.5°F | Resp 18 | Ht 64.0 in | Wt 104.0 lb

## 2012-09-11 DIAGNOSIS — F17200 Nicotine dependence, unspecified, uncomplicated: Secondary | ICD-10-CM

## 2012-09-11 DIAGNOSIS — F988 Other specified behavioral and emotional disorders with onset usually occurring in childhood and adolescence: Secondary | ICD-10-CM

## 2012-09-11 DIAGNOSIS — J209 Acute bronchitis, unspecified: Secondary | ICD-10-CM

## 2012-09-11 MED ORDER — AZITHROMYCIN 250 MG PO TABS: ORAL_TABLET | ORAL | Status: DC

## 2012-09-11 MED ORDER — VARENICLINE TARTRATE 0.5 MG PO TABS: 0.5000 mg | ORAL_TABLET | Freq: Two times a day (BID) | ORAL | Status: DC

## 2012-09-11 MED ORDER — PREDNISONE 20 MG PO TABS: ORAL_TABLET | ORAL | Status: DC

## 2012-09-11 MED ORDER — AMPHETAMINE-DEXTROAMPHETAMINE 20 MG PO TABS: 20.0000 mg | ORAL_TABLET | Freq: Two times a day (BID) | ORAL | Status: DC

## 2012-09-11 NOTE — Progress Notes (Signed)
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  She says the cough is minimally productive and clear She smokes No h/o asthma  Needs refill for adderall  F/H neg for asthma  Objective:  NAD HEENT:  Unremarkable Neck: no adenopathy Chest:  Bilateral wheezes. Heart:  Reg, no murmur Ext:  No edema Skin: no rashes  Assessment:  Bronchitis, ADHD  Plan:  Encouraged to quit cigarettes Prednisone and z pak  Signed, Elvina Sidle, MD

## 2012-09-11 NOTE — Telephone Encounter (Signed)
It is fine with me if someone else refills her adderall

## 2012-09-11 NOTE — Patient Instructions (Addendum)
Attention Deficit Hyperactivity Disorder Attention deficit hyperactivity disorder (ADHD) is a problem with behavior issues based on the way the brain functions (neurobehavioral disorder). It is a common reason for behavior and academic problems in school. CAUSES  The cause of ADHD is unknown in most cases. It may run in families. It sometimes can be associated with learning disabilities and other behavioral problems. SYMPTOMS  There are 3 types of ADHD. The 3 types and some of the symptoms include:  Inattentive  Gets bored or distracted easily.  Loses or forgets things. Forgets to hand in homework.  Has trouble organizing or completing tasks.  Difficulty staying on task.  An inability to organize daily tasks and school work.  Leaving projects, chores, or homework unfinished.  Trouble paying attention or responding to details. Careless mistakes.  Difficulty following directions. Often seems like is not listening.  Dislikes activities that require sustained attention (like chores or homework).  Hyperactive-impulsive  Feels like it is impossible to sit still or stay in a seat. Fidgeting with hands and feet.  Trouble waiting turn.  Talking too much or out of turn. Interruptive.  Speaks or acts impulsively.  Aggressive, disruptive behavior.  Constantly busy or on the go, noisy.  Combined  Has symptoms of both of the above. Often children with ADHD feel discouraged about themselves and with school. They often perform well below their abilities in school. These symptoms can cause problems in home, school, and in relationships with peers. As children get older, the excess motor activities can calm down, but the problems with paying attention and staying organized persist. Most children do not outgrow ADHD but with good treatment can learn to cope with the symptoms. DIAGNOSIS  When ADHD is suspected, the diagnosis should be made by professionals trained in ADHD.  Diagnosis will  include:  Ruling out other reasons for the child's behavior.  The caregivers will check with the child's school and check their medical records.  They will talk to teachers and parents.  Behavior rating scales for the child will be filled out by those dealing with the child on a daily basis. A diagnosis is made only after all information has been considered. TREATMENT  Treatment usually includes behavioral treatment often along with medicines. It may include stimulant medicines. The stimulant medicines decrease impulsivity and hyperactivity and increase attention. Other medicines used include antidepressants and certain blood pressure medicines. Most experts agree that treatment for ADHD should address all aspects of the child's functioning. Treatment should not be limited to the use of medicines alone. Treatment should include structured classroom management. The parents must receive education to address rewarding good behavior, discipline, and limit-setting. Tutoring or behavioral therapy or both should be available for the child. If untreated, the disorder can have long-term serious effects into adolescence and adulthood. HOME CARE INSTRUCTIONS   Often with ADHD there is a lot of frustration among the family in dealing with the illness. There is often blame and anger that is not warranted. This is a life long illness. There is no way to prevent ADHD. In many cases, because the problem affects the family as a whole, the entire family may need help. A therapist can help the family find better ways to handle the disruptive behaviors and promote change. If the child is young, most of the therapist's work is with the parents. Parents will learn techniques for coping with and improving their child's behavior. Sometimes only the child with the ADHD needs counseling. Your caregivers can help   you make these decisions.  Children with ADHD may need help in organizing. Some helpful tips include:  Keep  routines the same every day from wake-up time to bedtime. Schedule everything. This includes homework and playtime. This should include outdoor and indoor recreation. Keep the schedule on the refrigerator or a bulletin board where it is frequently seen. Mark schedule changes as far in advance as possible.  Have a place for everything and keep everything in its place. This includes clothing, backpacks, and school supplies.  Encourage writing down assignments and bringing home needed books.  Offer your child a well-balanced diet. Breakfast is especially important for school performance. Children should avoid drinks with caffeine including:  Soft drinks.  Coffee.  Tea.  However, some older children (adolescents) may find these drinks helpful in improving their attention.  Children with ADHD need consistent rules that they can understand and follow. If rules are followed, give small rewards. Children with ADHD often receive, and expect, criticism. Look for good behavior and praise it. Set realistic goals. Give clear instructions. Look for activities that can foster success and self-esteem. Make time for pleasant activities with your child. Give lots of affection.  Parents are their children's greatest advocates. Learn as much as possible about ADHD. This helps you become a stronger and better advocate for your child. It also helps you educate your child's teachers and instructors if they feel inadequate in these areas. Parent support groups are often helpful. A national group with local chapters is called CHADD (Children and Adults with Attention Deficit Hyperactivity Disorder). PROGNOSIS  There is no cure for ADHD. Children with the disorder seldom outgrow it. Many find adaptive ways to accommodate the ADHD as they mature. SEEK MEDICAL CARE IF:  Your child has repeated muscle twitches, cough or speech outbursts.  Your child has sleep problems.  Your child has a marked loss of  appetite.  Your child develops depression.  Your child has new or worsening behavioral problems.  Your child develops dizziness.  Your child has a racing heart.  Your child has stomach pains.  Your child develops headaches. Document Released: 04/05/2002 Document Revised: 07/08/2011 Document Reviewed: 11/16/2007 [facility] Patient Information 2013 [place], [place]. Bronchitis Bronchitis is the body's way of reacting to injury and/or infection (inflammation) of the bronchi. Bronchi are the air tubes that extend from the windpipe into the lungs. If the inflammation becomes severe, it may cause shortness of breath. CAUSES  Inflammation may be caused by:  A virus.  Germs (bacteria).  Dust.  Allergens.  Pollutants and many other irritants. The cells lining the bronchial tree are covered with tiny hairs (cilia). These constantly beat upward, away from the lungs, toward the mouth. This keeps the lungs free of pollutants. When these cells become too irritated and are unable to do their job, mucus begins to develop. This causes the characteristic cough of bronchitis. The cough clears the lungs when the cilia are unable to do their job. Without either of these protective mechanisms, the mucus would settle in the lungs. Then you would develop pneumonia. Smoking is a common cause of bronchitis and can contribute to pneumonia. Stopping this habit is the single most important thing you can do to help yourself. TREATMENT   Your caregiver may prescribe an antibiotic if the cough is caused by bacteria. Also, medicines that open up your airways make it easier to breathe. Your caregiver may also recommend or prescribe an expectorant. It will loosen the mucus to be coughed up. Only  take over-the-counter or prescription medicines for pain, discomfort, or fever as directed by your caregiver.  Removing whatever causes the problem (smoking, for example) is critical to preventing the problem from getting  worse.  Cough suppressants may be prescribed for relief of cough symptoms.  Inhaled medicines may be prescribed to help with symptoms now and to help prevent problems from returning.  For those with recurrent (chronic) bronchitis, there may be a need for steroid medicines. SEEK IMMEDIATE MEDICAL CARE IF:   During treatment, you develop more pus-like mucus (purulent sputum).  You have a fever.  Your baby is older than 3 months with a rectal temperature of 102 F (38.9 C) or higher.  Your baby is 59 months old or younger with a rectal temperature of 100.4 F (38 C) or higher.  You become progressively more ill.  You have increased difficulty breathing, wheezing, or shortness of breath. It is necessary to seek immediate medical care if you are elderly or sick from any other disease. MAKE SURE YOU:   Understand these instructions.  Will watch your condition.  Will get help right away if you are not doing well or get worse. Document Released: 04/15/2005 Document Revised: 07/08/2011 Document Reviewed: 02/23/2008 [facility] Patient Information 2013 [place], [place].

## 2012-09-11 NOTE — Telephone Encounter (Signed)
Patient here in office today being seen for illness. She was also requesting refill for adderall. Controlled medication policy explained to her. She wasn't aware of the policy. Please advise.

## 2013-01-13 ENCOUNTER — Encounter: Payer: Self-pay | Admitting: *Deleted

## 2013-01-13 DIAGNOSIS — S32030A Wedge compression fracture of third lumbar vertebra, initial encounter for closed fracture: Secondary | ICD-10-CM | POA: Insufficient documentation

## 2014-11-21 ENCOUNTER — Ambulatory Visit (INDEPENDENT_AMBULATORY_CARE_PROVIDER_SITE_OTHER): Payer: Self-pay | Admitting: Internal Medicine

## 2014-11-21 VITALS — BP 122/74 | HR 85 | Temp 98.9°F | Resp 16 | Ht 64.0 in | Wt 94.8 lb

## 2014-11-21 DIAGNOSIS — J029 Acute pharyngitis, unspecified: Secondary | ICD-10-CM

## 2014-11-21 LAB — POCT CBC
GRANULOCYTE PERCENT: 71.9 % (ref 37–80)
HCT, POC: 40.7 % (ref 37.7–47.9)
HEMOGLOBIN: 13.4 g/dL (ref 12.2–16.2)
LYMPH, POC: 3 (ref 0.6–3.4)
MCH: 29.4 pg (ref 27–31.2)
MCHC: 32.8 g/dL (ref 31.8–35.4)
MCV: 89.5 fL (ref 80–97)
MID (cbc): 0.8 (ref 0–0.9)
MPV: 8.8 fL (ref 0–99.8)
POC Granulocyte: 9.6 — AB (ref 2–6.9)
POC LYMPH PERCENT: 22.5 %L (ref 10–50)
POC MID %: 5.6 % (ref 0–12)
Platelet Count, POC: 225 10*3/uL (ref 142–424)
RBC: 4.55 M/uL (ref 4.04–5.48)
RDW, POC: 13.9 %
WBC: 13.4 10*3/uL — AB (ref 4.6–10.2)

## 2014-11-21 LAB — POCT RAPID STREP A (OFFICE): Rapid Strep A Screen: NEGATIVE

## 2014-11-21 MED ORDER — AMOXICILLIN 500 MG PO CAPS: 1000.0000 mg | ORAL_CAPSULE | Freq: Two times a day (BID) | ORAL | Status: DC

## 2014-11-21 MED ORDER — CEFTRIAXONE SODIUM 1 G IJ SOLR: 1.0000 g | Freq: Once | INTRAMUSCULAR | Status: DC
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
## 2014-11-23 LAB — CULTURE, GROUP A STREP: Organism ID, Bacteria: NORMAL

## 2015-04-30 NOTE — L&D Delivery Note (Signed)
Delivery Note Pt was complete at 5pm. She pushed for approx 20mins to deliver at 5:23 PM. She delivered a viable female via Vaginal, Spontaneous Delivery (Presentation:LOA   ).  APGAR: 9, 10; weight : P.   Placenta status: Delivered spontaneously; shultz, .  Cord:  with the following complications: none .   Anesthesia:  Epidural Episiotomy: Median Lacerations: 1st degree extended into second Suture Repair: 2.0 vicryl and 4-0 vicryl rapide Est. Blood Loss (mL): 75  Mom to postpartum.  Baby to Couplet care / Skin to Skin.  Whitney Little 01/31/2016, 5:46 PM

## 2015-06-29 LAB — OB RESULTS CONSOLE GC/CHLAMYDIA
CHLAMYDIA, DNA PROBE: NEGATIVE
Gonorrhea: NEGATIVE

## 2015-06-29 LAB — OB RESULTS CONSOLE RPR: RPR: NONREACTIVE

## 2015-06-29 LAB — OB RESULTS CONSOLE HIV ANTIBODY (ROUTINE TESTING): HIV: NONREACTIVE

## 2015-06-29 LAB — OB RESULTS CONSOLE ABO/RH: RH Type: POSITIVE

## 2015-06-29 LAB — OB RESULTS CONSOLE HEPATITIS B SURFACE ANTIGEN: HEP B S AG: NEGATIVE

## 2015-06-29 LAB — OB RESULTS CONSOLE RUBELLA ANTIBODY, IGM: Rubella: NON-IMMUNE/NOT IMMUNE

## 2015-06-29 LAB — OB RESULTS CONSOLE ANTIBODY SCREEN: ANTIBODY SCREEN: NEGATIVE

## 2015-07-07 ENCOUNTER — Inpatient Hospital Stay (HOSPITAL_COMMUNITY)
Admission: AD | Admit: 2015-07-07 | Payer: PRIVATE HEALTH INSURANCE | Source: Ambulatory Visit | Admitting: Obstetrics and Gynecology

## 2015-12-28 LAB — OB RESULTS CONSOLE GBS: STREP GROUP B AG: NEGATIVE

## 2016-01-22 ENCOUNTER — Encounter (HOSPITAL_COMMUNITY): Payer: Self-pay | Admitting: *Deleted

## 2016-01-22 ENCOUNTER — Telehealth (HOSPITAL_COMMUNITY): Payer: Self-pay | Admitting: *Deleted

## 2016-01-22 NOTE — Telephone Encounter (Signed)
Preadmission screen  

## 2016-01-30 ENCOUNTER — Inpatient Hospital Stay (HOSPITAL_COMMUNITY)
Admission: RE | Admit: 2016-01-30 | Discharge: 2016-01-30 | Disposition: A | Discharge status: Home / Self Care | Payer: PRIVATE HEALTH INSURANCE | Source: Ambulatory Visit | Attending: Obstetrics and Gynecology | Admitting: Obstetrics and Gynecology
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
## 2016-01-31 ENCOUNTER — Inpatient Hospital Stay (HOSPITAL_COMMUNITY)
Admission: RE | Admit: 2016-01-31 | Discharge: 2016-02-01 | DRG: 775 | Disposition: A | Discharge status: Home / Self Care | Payer: Medicaid Other | Source: Ambulatory Visit | Attending: Obstetrics and Gynecology | Admitting: Obstetrics and Gynecology

## 2016-01-31 ENCOUNTER — Encounter (HOSPITAL_COMMUNITY): Payer: Self-pay

## 2016-01-31 ENCOUNTER — Inpatient Hospital Stay (HOSPITAL_COMMUNITY): Payer: Medicaid Other | Admitting: Anesthesiology

## 2016-01-31 DIAGNOSIS — Z3490 Encounter for supervision of normal pregnancy, unspecified, unspecified trimester: Secondary | ICD-10-CM

## 2016-01-31 DIAGNOSIS — Z3A40 40 weeks gestation of pregnancy: Secondary | ICD-10-CM

## 2016-01-31 DIAGNOSIS — Z3403 Encounter for supervision of normal first pregnancy, third trimester: Secondary | ICD-10-CM | POA: Diagnosis present

## 2016-01-31 LAB — CBC
HEMATOCRIT: 35.2 % — AB (ref 36.0–46.0)
HEMOGLOBIN: 12.5 g/dL (ref 12.0–15.0)
MCH: 32.7 pg (ref 26.0–34.0)
MCHC: 35.5 g/dL (ref 30.0–36.0)
MCV: 92.1 fL (ref 78.0–100.0)
Platelets: 160 10*3/uL (ref 150–400)
RBC: 3.82 MIL/uL — ABNORMAL LOW (ref 3.87–5.11)
RDW: 13.6 % (ref 11.5–15.5)
WBC: 10.2 10*3/uL (ref 4.0–10.5)

## 2016-01-31 LAB — RPR: RPR: NONREACTIVE

## 2016-01-31 LAB — TYPE AND SCREEN
ABO/RH(D): A POS
Antibody Screen: NEGATIVE

## 2016-01-31 MED ORDER — EPHEDRINE 5 MG/ML INJ
10.0000 mg | INTRAVENOUS | Status: DC | PRN
  Filled 2016-01-31: qty 4

## 2016-01-31 MED ORDER — INFLUENZA VAC SPLIT QUAD 0.5 ML IM SUSY
0.5000 mL | PREFILLED_SYRINGE | INTRAMUSCULAR | Status: AC
  Administered 2016-02-01: 0.5 mL via INTRAMUSCULAR
  Filled 2016-01-31: qty 0.5

## 2016-01-31 MED ORDER — ONDANSETRON HCL 4 MG/2ML IJ SOLN: 4.0000 mg | Freq: Four times a day (QID) | INTRAMUSCULAR | Status: DC | PRN

## 2016-01-31 MED ORDER — OXYCODONE-ACETAMINOPHEN 5-325 MG PO TABS: 2.0000 | ORAL_TABLET | ORAL | Status: DC | PRN

## 2016-01-31 MED ORDER — ONDANSETRON HCL 4 MG PO TABS: 4.0000 mg | ORAL_TABLET | ORAL | Status: DC | PRN

## 2016-01-31 MED ORDER — TERBUTALINE SULFATE 1 MG/ML IJ SOLN
0.2500 mg | Freq: Once | INTRAMUSCULAR | Status: DC | PRN
  Filled 2016-01-31: qty 1

## 2016-01-31 MED ORDER — FENTANYL 2.5 MCG/ML BUPIVACAINE 1/10 % EPIDURAL INFUSION (WH - ANES): 14.0000 mL/h | INTRAMUSCULAR | Status: DC | PRN

## 2016-01-31 MED ORDER — DIPHENHYDRAMINE HCL 25 MG PO CAPS: 25.0000 mg | ORAL_CAPSULE | Freq: Four times a day (QID) | ORAL | Status: DC | PRN

## 2016-01-31 MED ORDER — LACTATED RINGERS IV SOLN
500.0000 mL | Freq: Once | INTRAVENOUS | Status: AC
  Administered 2016-01-31: 500 mL via INTRAVENOUS

## 2016-01-31 MED ORDER — MISOPROSTOL 25 MCG QUARTER TABLET
25.0000 ug | ORAL_TABLET | ORAL | Status: DC | PRN
  Administered 2016-01-31 (×2): 25 ug via VAGINAL
  Filled 2016-01-31: qty 0.25
  Filled 2016-01-31: qty 1
  Filled 2016-01-31: qty 0.25

## 2016-01-31 MED ORDER — OXYCODONE-ACETAMINOPHEN 5-325 MG PO TABS: 1.0000 | ORAL_TABLET | ORAL | Status: DC | PRN

## 2016-01-31 MED ORDER — ONDANSETRON HCL 4 MG/2ML IJ SOLN: 4.0000 mg | INTRAMUSCULAR | Status: DC | PRN

## 2016-01-31 MED ORDER — LIDOCAINE HCL (PF) 1 % IJ SOLN
30.0000 mL | INTRAMUSCULAR | Status: DC | PRN
  Filled 2016-01-31: qty 30

## 2016-01-31 MED ORDER — PHENYLEPHRINE 40 MCG/ML (10ML) SYRINGE FOR IV PUSH (FOR BLOOD PRESSURE SUPPORT)
80.0000 ug | PREFILLED_SYRINGE | INTRAVENOUS | Status: DC | PRN
  Filled 2016-01-31: qty 5

## 2016-01-31 MED ORDER — BUPRENORPHINE HCL 8 MG SL SUBL
28.0000 mg | SUBLINGUAL_TABLET | Freq: Every day | SUBLINGUAL | Status: DC
  Administered 2016-02-01: 28 mg via SUBLINGUAL
  Filled 2016-01-31: qty 3.5

## 2016-01-31 MED ORDER — OXYTOCIN 40 UNITS IN LACTATED RINGERS INFUSION - SIMPLE MED: 2.5000 [IU]/h | INTRAVENOUS | Status: DC

## 2016-01-31 MED ORDER — WITCH HAZEL-GLYCERIN EX PADS: 1.0000 "application " | MEDICATED_PAD | CUTANEOUS | Status: DC | PRN

## 2016-01-31 MED ORDER — PHENYLEPHRINE 40 MCG/ML (10ML) SYRINGE FOR IV PUSH (FOR BLOOD PRESSURE SUPPORT)
80.0000 ug | PREFILLED_SYRINGE | INTRAVENOUS | Status: DC | PRN
  Filled 2016-01-31: qty 5
  Filled 2016-01-31: qty 10

## 2016-01-31 MED ORDER — OXYTOCIN 40 UNITS IN LACTATED RINGERS INFUSION - SIMPLE MED
1.0000 m[IU]/min | INTRAVENOUS | Status: DC
  Administered 2016-01-31: 2 m[IU]/min via INTRAVENOUS
  Filled 2016-01-31: qty 1000

## 2016-01-31 MED ORDER — ZOLPIDEM TARTRATE 5 MG PO TABS: 5.0000 mg | ORAL_TABLET | Freq: Every evening | ORAL | Status: DC | PRN

## 2016-01-31 MED ORDER — ACETAMINOPHEN 325 MG PO TABS
650.0000 mg | ORAL_TABLET | ORAL | Status: DC | PRN
  Administered 2016-01-31 – 2016-02-01 (×2): 650 mg via ORAL
  Filled 2016-01-31 (×2): qty 2

## 2016-01-31 MED ORDER — SOD CITRATE-CITRIC ACID 500-334 MG/5ML PO SOLN: 30.0000 mL | ORAL | Status: DC | PRN

## 2016-01-31 MED ORDER — BUTORPHANOL TARTRATE 1 MG/ML IJ SOLN
1.0000 mg | INTRAMUSCULAR | Status: DC | PRN
  Administered 2016-01-31: 1 mg via INTRAVENOUS
  Filled 2016-01-31: qty 1

## 2016-01-31 MED ORDER — DIPHENHYDRAMINE HCL 50 MG/ML IJ SOLN: 12.5000 mg | INTRAMUSCULAR | Status: DC | PRN

## 2016-01-31 MED ORDER — PRENATAL MULTIVITAMIN CH
1.0000 | ORAL_TABLET | Freq: Every day | ORAL | Status: DC
  Administered 2016-02-01: 1 via ORAL
  Filled 2016-01-31: qty 1

## 2016-01-31 MED ORDER — SIMETHICONE 80 MG PO CHEW: 80.0000 mg | CHEWABLE_TABLET | ORAL | Status: DC | PRN

## 2016-01-31 MED ORDER — ACETAMINOPHEN 325 MG PO TABS: 650.0000 mg | ORAL_TABLET | ORAL | Status: DC | PRN

## 2016-01-31 MED ORDER — LACTATED RINGERS IV SOLN
500.0000 mL | INTRAVENOUS | Status: DC | PRN
  Administered 2016-01-31: 250 mL via INTRAVENOUS

## 2016-01-31 MED ORDER — OXYTOCIN BOLUS FROM INFUSION
500.0000 mL | Freq: Once | INTRAVENOUS | Status: AC
  Administered 2016-01-31: 500 mL/h via INTRAVENOUS

## 2016-01-31 MED ORDER — LIDOCAINE HCL (PF) 1 % IJ SOLN
INTRAMUSCULAR | Status: DC | PRN
  Administered 2016-01-31 (×2): 4 mL

## 2016-01-31 MED ORDER — DIBUCAINE 1 % RE OINT: 1.0000 "application " | TOPICAL_OINTMENT | RECTAL | Status: DC | PRN

## 2016-01-31 MED ORDER — SENNOSIDES-DOCUSATE SODIUM 8.6-50 MG PO TABS
2.0000 | ORAL_TABLET | ORAL | Status: DC
  Filled 2016-01-31: qty 2

## 2016-01-31 MED ORDER — TETANUS-DIPHTH-ACELL PERTUSSIS 5-2.5-18.5 LF-MCG/0.5 IM SUSP: 0.5000 mL | Freq: Once | INTRAMUSCULAR | Status: DC

## 2016-01-31 MED ORDER — COCONUT OIL OIL: 1.0000 "application " | TOPICAL_OIL | Status: DC | PRN

## 2016-01-31 MED ORDER — BENZOCAINE-MENTHOL 20-0.5 % EX AERO
1.0000 "application " | INHALATION_SPRAY | CUTANEOUS | Status: DC | PRN
  Administered 2016-01-31 – 2016-02-01 (×2): 1 via TOPICAL
  Filled 2016-01-31 (×2): qty 56

## 2016-01-31 MED ORDER — IBUPROFEN 600 MG PO TABS
600.0000 mg | ORAL_TABLET | Freq: Four times a day (QID) | ORAL | Status: DC
  Administered 2016-01-31 – 2016-02-01 (×3): 600 mg via ORAL
  Filled 2016-01-31 (×3): qty 1

## 2016-01-31 MED ORDER — LACTATED RINGERS IV SOLN
INTRAVENOUS | Status: DC
  Administered 2016-01-31 (×2): via INTRAVENOUS

## 2016-01-31 MED ORDER — FENTANYL 2.5 MCG/ML BUPIVACAINE 1/10 % EPIDURAL INFUSION (WH - ANES)
14.0000 mL/h | INTRAMUSCULAR | Status: DC | PRN
  Administered 2016-01-31: 14 mL/h via EPIDURAL
  Filled 2016-01-31: qty 125

## 2016-01-31 NOTE — Progress Notes (Signed)
Patient ID: Whitney MemosStephanie L Little, female   DOB: 10/31/1985, 30 y.o.   MRN: [id number] Pt comfortable with epidural Pit at 2mus  SVE 4-5/100/-1 Cat 1, early decels , 125 Contractions q 1-622mins  A/P: Continue current care         AROM with small amount of bloody fluid return         Anticipate svd

## 2016-01-31 NOTE — Anesthesia Pain Management Evaluation Note (Signed)
  CRNA Pain Management Visit Note  Patient: Whitney Little, 30 y.o., female  "Hello I am a member of the anesthesia team at [facility]. We have an anesthesia team available at all times to provide care throughout the hospital, including epidural management and anesthesia for C-section. I don't know your plan for the delivery whether it a natural birth, water birth, IV sedation, nitrous supplementation, doula or epidural, but we want to meet your pain goals."   1.Was your pain managed to your expectations on prior hospitalizations?   No prior hospitalizations  2.What is your expectation for pain management during this hospitalization?     Epidural  3.How can we help you reach that goal? Epidural.  Record the patient's initial score and the patient's pain goal.   Pain: 5  Pain Goal: 5 [facility] wants you to be able to say your pain was always managed very well.  Vitalia Stough L 01/31/2016

## 2016-01-31 NOTE — Progress Notes (Signed)
Delivery of live viable female by Dr Mindi SlickerBanga. APGARS 9, 10

## 2016-01-31 NOTE — Anesthesia Preprocedure Evaluation (Signed)
Anesthesia Evaluation  Patient identified by MRN, date of birth, ID band Patient awake    Reviewed: Allergy & Precautions, H&P , NPO status , Patient's Chart, lab work & pertinent test results, reviewed documented beta blocker date and time   History of Anesthesia Complications (+) PONV and history of anesthetic complications  Airway Mallampati: I  TM Distance: >3 FB Neck ROM: full    Dental  (+) Teeth Intact   Pulmonary Current Smoker,  smoker   breath sounds clear to auscultation       Cardiovascular negative cardio ROS   Rhythm:regular Rate:Normal + Systolic murmurs    Neuro/Psych negative neurological ROS  negative psych ROS   GI/Hepatic negative GI ROS, Neg liver ROS,   Endo/Other  negative endocrine ROS  Renal/GU negative Renal ROS     Musculoskeletal   Abdominal   Peds  Hematology negative hematology ROS (+)   Anesthesia Other Findings   Reproductive/Obstetrics (+) Pregnancy (missed ab)                             Anesthesia Physical  Anesthesia Plan  ASA: II  Anesthesia Plan: Epidural   Post-op Pain Management:    Induction:   Airway Management Planned:   Additional Equipment:   Intra-op Plan:   Post-operative Plan:   Informed Consent: I have reviewed the patients History and Physical, chart, labs and discussed the procedure including the risks, benefits and alternatives for the proposed anesthesia with the patient or authorized representative who has indicated his/her understanding and acceptance.     Plan Discussed with:   Anesthesia Plan Comments:         Anesthesia Quick Evaluation

## 2016-01-31 NOTE — Progress Notes (Signed)
Patient ID: Whitney MemosStephanie L Little, female   DOB: October 26, 1985, 30 y.o.   MRN: [id number] Late entry  Pt doing well. She is beginning to appreciate contractions. No VB or LOF Cat 1 TOCO q 5-3710mins SVE 1-2cm on last placement of cytotec at 645  A/P: G1P0 @ 40 0/7wks for elective term iol; doing well         Will arom when able then pitocin         Epidural prn         Anticipate svd

## 2016-02-01 LAB — CBC
HCT: 33.6 % — ABNORMAL LOW (ref 36.0–46.0)
Hemoglobin: 11.9 g/dL — ABNORMAL LOW (ref 12.0–15.0)
MCH: 32.9 pg (ref 26.0–34.0)
MCHC: 35.4 g/dL (ref 30.0–36.0)
MCV: 92.8 fL (ref 78.0–100.0)
PLATELETS: 160 10*3/uL (ref 150–400)
RBC: 3.62 MIL/uL — ABNORMAL LOW (ref 3.87–5.11)
RDW: 13.3 % (ref 11.5–15.5)
WBC: 14 10*3/uL — ABNORMAL HIGH (ref 4.0–10.5)

## 2016-02-01 MED ORDER — IBUPROFEN 600 MG PO TABS: 600.0000 mg | ORAL_TABLET | Freq: Four times a day (QID) | ORAL | 0 refills | Status: DC

## 2016-02-01 MED ORDER — ACETAMINOPHEN 325 MG PO TABS: 650.0000 mg | ORAL_TABLET | ORAL | 0 refills | Status: DC | PRN

## 2016-02-01 NOTE — Progress Notes (Signed)
Post Partum Day 1 Subjective: no complaints.  Pt feels very well and states that she wants to be d/c today.  Advised that the baby would likely not be a candidate for this and she states she would like to go anyway and come back for the baby at d/c  Objective: Blood pressure 113/66, pulse 72, temperature 98.4 F (36.9 C), temperature source Oral, resp. rate 18, height 5\' 4"  (1.626 m), weight 54.9 kg (121 lb), last menstrual period 04/11/2015, unknown if currently breastfeeding.  Physical Exam:  General: alert and cooperative Lochia: appropriate Uterine Fundus: firm    Recent Labs  01/31/16 0200 02/01/16 0550  HGB 12.5 11.9*  HCT 35.2* 33.6*    Assessment/Plan: Discharge home as pt adamant she wants to go Motrin,tylenol, subutex Alerted nursing and peds that patient is planning d/c   LOS: 1 day   Whitney Little 02/01/2016, 9:23 AM

## 2016-02-01 NOTE — Discharge Summary (Signed)
OB Discharge Summary     Patient Name: Whitney Little DOB: [birth year] MRN: [id number]  Date of admission: 01/31/2016 Delivering MD: Pryor Ochoa [facility]   Date of discharge: 02/01/2016  Admitting diagnosis: DIRECT ADMIT INDUCTION Intrauterine pregnancy: [redacted]w[redacted]d     Secondary diagnosis:  Active Problems:   Term pregnancy   SVD (spontaneous vaginal delivery)   Postpartum care following vaginal delivery  Additional problems: subutex treatment     Discharge diagnosis: Term Pregnancy Delivered                                                                                                Post partum procedures:none  Augmentation: AROM and Pitocin  Complications: None  Hospital course:  Induction of Labor With Vaginal Delivery   30 y.o. yo G3P1011 at [redacted]w[redacted]d was admitted to the hospital 01/31/2016 for induction of labor.  Indication for induction: Favorable cervix at term.  Patient had an uncomplicated labor course as follows: Membrane Rupture Time/Date: 1:04 PM ,01/31/2016   Intrapartum Procedures: Episiotomy: Median [2]                                         Lacerations:  1st degree [2]  Patient had delivery of a Viable infant.  Information for the patient's newborn:  Kecia, Swoboda [[id number]]  Delivery Method: Vaginal, Spontaneous Delivery (Filed from Delivery Summary)   01/31/2016  Details of delivery can be found in separate delivery note.  Patient had a routine postpartum course. Patient is discharged home 02/01/16.   Physical exam  Vitals:   01/31/16 1911 01/31/16 2019 02/01/16 0037 02/01/16 0531  BP: (!) 113/91 120/62 (!) 108/54 113/66  Pulse: 76 80 76 72  Resp: 16 16 20 18   Temp: 97.7 F (36.5 C) 99.3 F (37.4 C) 98.3 F (36.8 C) 98.4 F (36.9 C)  TempSrc: Oral Oral  Oral  Weight:      Height:       General: alert and cooperative Lochia: appropriate Uterine Fundus: firm  Labs: Lab Results  Component Value Date   WBC 14.0 (H) 02/01/2016   HGB  11.9 (L) 02/01/2016   HCT 33.6 (L) 02/01/2016   MCV 92.8 02/01/2016   PLT 160 02/01/2016   No flowsheet data found.  Discharge instruction: per After Visit Summary and "Baby and Me Booklet".  After visit meds:    Medication List    TAKE these medications   acetaminophen 325 MG tablet Commonly known as:  TYLENOL Take 2 tablets (650 mg total) by mouth every 4 (four) hours as needed (for pain scale < 4).   buprenorphine 8 MG Subl SL tablet Commonly known as:  SUBUTEX Place 28 mg under the tongue daily.   ibuprofen 600 MG tablet Commonly known as:  ADVIL,MOTRIN Take 1 tablet (600 mg total) by mouth every 6 (six) hours.   multivitamin-prenatal 27-0.8 MG Tabs tablet Take 1 tablet by mouth at bedtime.       Diet: routine diet  Activity: Advance as  tolerated. Pelvic rest for 6 weeks.   Outpatient follow up:6 weeks Follow up Appt:No future appointments. Follow up Visit:No Follow-up on file.  Postpartum contraception: Combination OCPs  Newborn Data: Live born female  Birth Weight: 6 lb 14 oz (3118 g) APGAR: 9, 10  Baby Feeding: Bottle Disposition:rooming in   02/01/2016 Oliver PilaICHARDSON,Azalee Weimer W, MD

## 2016-02-01 NOTE — Progress Notes (Signed)
Patient rubella non immune. Information given and discussed with the patient about vaccine. Patient declines vaccine at this time. Earl Galasborne, Linda HedgesStefanie RosaliaHudspeth

## 2016-02-01 NOTE — Anesthesia Postprocedure Evaluation (Signed)
Anesthesia Post Note  Patient: [name]  Procedure(s) Performed: * No procedures listed *  Patient location during evaluation: Mother Baby Anesthesia Type: Epidural Level of consciousness: awake and alert Pain management: pain level controlled Vital Signs Assessment: post-procedure vital signs reviewed and stable Respiratory status: spontaneous breathing and nonlabored ventilation Cardiovascular status: stable Postop Assessment: no headache, no backache, patient able to bend at knees, epidural receding, no signs of nausea or vomiting and adequate PO intake Anesthetic complications: no     Last Vitals:  Vitals:   02/01/16 0037 02/01/16 0531  BP: (!) 108/54 113/66  Pulse: 76 72  Resp: 20 18  Temp: 36.8 C 36.9 C    Last Pain:  Vitals:   02/01/16 0531  TempSrc: Oral  PainSc:    Pain Goal: Patients Stated Pain Goal: 2 (02/01/16 0410)               [name],[name]

## 2016-02-01 NOTE — Clinical Social Work Maternal (Signed)
CLINICAL SOCIAL WORK MATERNAL/CHILD NOTE  Patient Details  Name: Whitney Little MRN: [id number] Date of Birth: 12/12/1985  Date:  02/01/2016  Clinical Social Worker Initiating Note:  Isabelly Kobler Boyd-Gilyard Date/ Time Initiated:  02/01/16/1407     Child's Name:  Whitney Little   Legal Guardian:  Mother   Need for Interpreter:  None   Date of Referral:  02/01/16     Reason for Referral:  Current Substance Use/Substance Use During Pregnancy  ([name] on Subutex)   Referral Source:      Address:  [address] [place] [address]  Phone number:  [phone]   Household Members:  Self, Minor Children, Significant Other   Natural Supports (not living in the home):  Extended Family, Immediate Family, Spouse/significant other, Parent   Professional Supports: Organized support group (Comment) (Support Group at [facility])   Employment: Unemployed   Type of Work:     Education:  High school graduate   Financial Resources:  Medicaid   Other Resources:  WIC, Food Stamps    Cultural/Religious Considerations Which May Impact Care:  None Reported  Strengths:  Ability to meet basic needs , Pediatrician chosen , Home prepared for child    Risk Factors/Current Problems:  Substance Use    Cognitive State:  Alert , Able to Concentrate , Linear Thinking    Mood/Affect:  Calm , Relaxed , Interested , Comfortable    CSW Assessment: CSW met with [name] to complete an assessment for hx of substance use.  When CSW arrived, [name] was being visited by FOB ([name]).  [name] gave CSW permission to meet with [name] while FOB was present.  FOB and [name] was inviting and was interested in meeting with CSW.  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  [name] reports receiving medication management daily from [facility].  CSW applauded [name] for being consistent with Subutex during pregnancy.  CSW offered  [name] outpatient counseling resources and [name] declined information.  [name] communicated that counseling resources are offered at [facility] and at this time, [name] is not interested. CSW also offered [name] community parenting programs and [name] declined resource as well. CSW informed [name] of the hospital's drug screen policy regarding substance use.  [name] was informed of the 2 screenings for the infant.  [name] was understanding and had no concerns. [name] denied a hx of CPS involvement. CSW explained to [name] that the infant's UDS was negative and CSW will continue to monitor the infant's cord. CSW made [name] aware that if infant's Cord Screen is positive without an explanation, CSW will make a report to [place] CPS. [name] stated that [name] was not concerned.  CSW discussed with [name] and FOB extensively about dc for [name]. CSW made parents aware that if [name] is discharged from the hospital and decides to leave the hospital and give [name]'s room up, [name] will not be allowed to get another room at a later date or time.  [name] and FOB understood.  CSW made the parents aware that infant will be located in the nursey when [name] leaves hospital and there will be limited visitation in the nursery (30minutes-1hr daily).  Both parents asked several questions and decided that [name] will leave hospital and will visit with infant daily for 30 mintues to 1 hour daily. CSW explained to both parents that the nursey was not a visitation room   and [name] and FOB will not be allowed to just hang out with infant for mor than an hour.  [name] expressed that [name] understood the visitation plan, and expressed that she had to leave due to the hospital bed being uncomfortable and the need for [name] to attend a morning class.  Both parents communicated they do not have anyone to stay in the room with infant while they left the hospital.  CSW expressed to [name] and FOB that the infant can stay in the nursey for 1-2 hours but that would be the maximum time.  [name] communicated  that [name] will need more than 2 hours daily to be away from the hospital.  FOB expressed that FOB works and it will impossible for FOB to stay at hospital with infant.  CSW explored all options with parents, and the parents decided that [name] would leave the hospital and lose her hospital room.  FOB asked if infant could stay in NICU and CSW explained to FOB and [name] that NICU was not an option for infant. FOB also inquired about the infant leaving the hospital prior to the infant's 5 day observation.  CSW explained to [name] and FOB that early dc for infant was not an option and offered to have medical team to speak with parent's to explain the importance of the 5 day observation.  [name] and FOB declined to speak with  medical staff.  [name] communicated to CSW that [name] will be leaving the hospital around 6pm today and plans to leave infant in nursey and visit with infant daily. [name] communicated that [name] is expecting infant to be dc by 5pm on Sunday,October 8th.  CSW communicated the parent's plan with CN staff.    CSW Plan/Description:  Patient/Family Education , No Further Intervention Required/No Barriers to Discharge, Information/Referral to Community Resources  (CSW will follow infant's cord and will make a report to [place] CPS if warranted. )   Arbor Leer Boyd-Gilyard, MSW, LCSW Clinical Social Work [phone]    Kyli Sorter D BOYD-GILYARD, LCSW 02/01/2016, 2:34 PM  

## 2019-10-08 ENCOUNTER — Encounter (HOSPITAL_COMMUNITY): Payer: Self-pay

## 2019-10-08 ENCOUNTER — Ambulatory Visit (HOSPITAL_COMMUNITY)
Admission: EM | Admit: 2019-10-08 | Discharge: 2019-10-08 | Disposition: A | Discharge status: Home / Self Care | Payer: Medicaid Other | Attending: Family Medicine | Admitting: Family Medicine

## 2019-10-08 DIAGNOSIS — L089 Local infection of the skin and subcutaneous tissue, unspecified: Secondary | ICD-10-CM

## 2019-10-08 DIAGNOSIS — B9689 Other specified bacterial agents as the cause of diseases classified elsewhere: Secondary | ICD-10-CM | POA: Diagnosis not present

## 2019-10-08 DIAGNOSIS — W57XXXA Bitten or stung by nonvenomous insect and other nonvenomous arthropods, initial encounter: Secondary | ICD-10-CM

## 2019-10-08 MED ORDER — DOXYCYCLINE HYCLATE 100 MG PO CAPS: ORAL_CAPSULE | ORAL | 0 refills | Status: DC
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
## 2019-10-08 NOTE — Discharge Instructions (Signed)
The red area will itch like an insect bite.  You may apply cortisone cream.  You may take Benadryl for the itching.  It will gradually resolve over time.  The small lump may be present for a couple of weeks  I am giving you a single dose of antibiotic.  This will prevent the development of Lyme disease  Return as needed

## 2019-10-08 NOTE — ED Triage Notes (Signed)
Pt states her daughter had a tic on her and she got it off and pt thought she had a tic on her back herself and was picking at her back and she really picked at a mole she had on her back. Pt states the mole now has red streaks coming from it and it's erythematous surrounding the mole. Pt states the mole is hanging on by a thread.

## 2020-08-15 ENCOUNTER — Encounter (HOSPITAL_COMMUNITY): Payer: Self-pay | Admitting: Emergency Medicine

## 2020-08-15 ENCOUNTER — Ambulatory Visit (HOSPITAL_COMMUNITY)
Admission: EM | Admit: 2020-08-15 | Discharge: 2020-08-15 | Disposition: A | Discharge status: Home / Self Care | Payer: Medicaid Other

## 2020-08-15 ENCOUNTER — Other Ambulatory Visit: Payer: Self-pay

## 2020-08-15 ENCOUNTER — Emergency Department (HOSPITAL_COMMUNITY): Payer: Medicaid Other

## 2020-08-15 ENCOUNTER — Ambulatory Visit (INDEPENDENT_AMBULATORY_CARE_PROVIDER_SITE_OTHER): Payer: Medicaid Other

## 2020-08-15 ENCOUNTER — Inpatient Hospital Stay (HOSPITAL_COMMUNITY)
Admission: EM | Admit: 2020-08-15 | Discharge: 2020-08-18 | DRG: 201 | Disposition: A | Discharge status: Home / Self Care | Payer: Medicaid Other | Attending: Internal Medicine | Admitting: Internal Medicine

## 2020-08-15 DIAGNOSIS — F988 Other specified behavioral and emotional disorders with onset usually occurring in childhood and adolescence: Secondary | ICD-10-CM | POA: Diagnosis present

## 2020-08-15 DIAGNOSIS — J9600 Acute respiratory failure, unspecified whether with hypoxia or hypercapnia: Secondary | ICD-10-CM | POA: Diagnosis present

## 2020-08-15 DIAGNOSIS — R0781 Pleurodynia: Secondary | ICD-10-CM | POA: Diagnosis not present

## 2020-08-15 DIAGNOSIS — N63 Unspecified lump in unspecified breast: Secondary | ICD-10-CM

## 2020-08-15 DIAGNOSIS — J939 Pneumothorax, unspecified: Secondary | ICD-10-CM | POA: Diagnosis not present

## 2020-08-15 DIAGNOSIS — J9383 Other pneumothorax: Secondary | ICD-10-CM

## 2020-08-15 DIAGNOSIS — F17210 Nicotine dependence, cigarettes, uncomplicated: Secondary | ICD-10-CM | POA: Diagnosis present

## 2020-08-15 DIAGNOSIS — Z79899 Other long term (current) drug therapy: Secondary | ICD-10-CM

## 2020-08-15 DIAGNOSIS — Z20822 Contact with and (suspected) exposure to covid-19: Secondary | ICD-10-CM | POA: Diagnosis present

## 2020-08-15 LAB — COMPREHENSIVE METABOLIC PANEL
ALT: 22 U/L (ref 0–44)
AST: 29 U/L (ref 15–41)
Albumin: 4.3 g/dL (ref 3.5–5.0)
Alkaline Phosphatase: 59 U/L (ref 38–126)
Anion gap: 6 (ref 5–15)
BUN: 11 mg/dL (ref 6–20)
CO2: 28 mmol/L (ref 22–32)
Calcium: 8.9 mg/dL (ref 8.9–10.3)
Chloride: 102 mmol/L (ref 98–111)
Creatinine, Ser: 0.8 mg/dL (ref 0.44–1.00)
GFR, Estimated: >60 mL/min (ref >60)
Glucose, Bld: 107 mg/dL — ABNORMAL HIGH (ref 70–99)
Potassium: 3.8 mmol/L (ref 3.5–5.1)
Sodium: 136 mmol/L (ref 135–145)
Total Bilirubin: 0.3 mg/dL (ref 0.3–1.2)
Total Protein: 6.7 g/dL (ref 6.5–8.1)

## 2020-08-15 LAB — RESP PANEL BY RT-PCR (FLU A&B, COVID) ARPGX2
Influenza A by PCR: NEGATIVE
Influenza B by PCR: NEGATIVE
SARS Coronavirus 2 by RT PCR: NEGATIVE

## 2020-08-15 LAB — POC URINE PREG, ED: Preg Test, Ur: NEGATIVE

## 2020-08-15 LAB — CBC
HCT: 45.3 % (ref 36.0–46.0)
Hemoglobin: 15 g/dL (ref 12.0–15.0)
MCH: 32.2 pg (ref 26.0–34.0)
MCHC: 33.1 g/dL (ref 30.0–36.0)
MCV: 97.2 fL (ref 80.0–100.0)
Platelets: 156 10*3/uL (ref 150–400)
RBC: 4.66 MIL/uL (ref 3.87–5.11)
RDW: 12 % (ref 11.5–15.5)
WBC: 6.6 10*3/uL (ref 4.0–10.5)
nRBC: 0 % (ref 0.0–0.2)

## 2020-08-15 MED ORDER — ACETAMINOPHEN 325 MG PO TABS
650.0000 mg | ORAL_TABLET | Freq: Four times a day (QID) | ORAL | Status: DC | PRN
  Administered 2020-08-16: 650 mg via ORAL
  Filled 2020-08-15: qty 2

## 2020-08-15 MED ORDER — PRENATAL MULTIVITAMIN CH
1.0000 | ORAL_TABLET | Freq: Every day | ORAL | Status: DC
  Administered 2020-08-16 – 2020-08-17 (×2): 1 via ORAL
  Filled 2020-08-15 (×3): qty 1

## 2020-08-15 MED ORDER — LORAZEPAM 2 MG/ML IJ SOLN: 1.0000 mg | Freq: Once | INTRAMUSCULAR | Status: DC | PRN

## 2020-08-15 MED ORDER — HYDROMORPHONE HCL 1 MG/ML IJ SOLN
1.0000 mg | Freq: Once | INTRAMUSCULAR | Status: AC
  Administered 2020-08-15: 1 mg via INTRAVENOUS
  Filled 2020-08-15: qty 1

## 2020-08-15 MED ORDER — FERROUS SULFATE 325 (65 FE) MG PO TABS
325.0000 mg | ORAL_TABLET | Freq: Every day | ORAL | Status: DC
  Administered 2020-08-16 – 2020-08-18 (×3): 325 mg via ORAL
  Filled 2020-08-15 (×3): qty 1

## 2020-08-15 MED ORDER — ACETAMINOPHEN 650 MG RE SUPP: 650.0000 mg | Freq: Four times a day (QID) | RECTAL | Status: DC | PRN

## 2020-08-15 MED ORDER — SODIUM CHLORIDE 0.9% FLUSH: 10.0000 mL | Freq: Three times a day (TID) | INTRAVENOUS | Status: DC

## 2020-08-15 MED ORDER — HYDROMORPHONE HCL 1 MG/ML IJ SOLN
1.0000 mg | INTRAMUSCULAR | Status: DC | PRN
  Administered 2020-08-16 – 2020-08-17 (×4): 1 mg via INTRAVENOUS
  Filled 2020-08-15 (×5): qty 1

## 2020-08-15 NOTE — Discharge Instructions (Signed)
GO TO THE ER
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
## 2020-08-15 NOTE — ED Triage Notes (Addendum)
Pt sent by UC for further evaluation of pneumothorax. Pt c/o right sided rib pain and shortness of breath that started Sunday. Hx of same.

## 2020-08-15 NOTE — ED Triage Notes (Signed)
On Sunday, patient noticed pain under right shoulder blade and radiating to the front.  Patient described as sharp pain.  Patient reports this pain reminds her of pain with collapse lung that she had many years ago.  Patient can take a deep breath.  Patient has found a knot on top of breast and 2 below.  Patient noticed knot above breast 2 weeks ago, 2 below noticed since then.

## 2020-08-15 NOTE — ED Notes (Signed)
Patient is being discharged from the Urgent Care and sent to the Emergency Department via [place], [place]  . Per Denny Peon , patient is in need of higher level of care due to symptoms. Patient is aware and verbalizes understanding of plan of care.  Vitals:   08/15/20 1517  BP: 129/70  Pulse: 88  Resp: 18  Temp: 98.7 F (37.1 C)  SpO2: 98%
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
## 2020-08-15 NOTE — ED Notes (Signed)
Patient is being discharged from the Urgent Care and sent to the Emergency Department via POV. Per Dr Zackery Barefoot, PA, patient is in need of higher level of care due to spontaneous pneumothorax. Patient is aware and verbalizes understanding of plan of care.  Vitals:   08/15/20 1517  BP: 129/70  Pulse: 88  Resp: 18  Temp: 98.7 F (37.1 C)  SpO2: 98%

## 2020-08-16 ENCOUNTER — Observation Stay (HOSPITAL_COMMUNITY): Payer: Medicaid Other

## 2020-08-16 DIAGNOSIS — F17210 Nicotine dependence, cigarettes, uncomplicated: Secondary | ICD-10-CM | POA: Diagnosis present

## 2020-08-16 DIAGNOSIS — R079 Chest pain, unspecified: Secondary | ICD-10-CM | POA: Diagnosis present

## 2020-08-16 DIAGNOSIS — F988 Other specified behavioral and emotional disorders with onset usually occurring in childhood and adolescence: Secondary | ICD-10-CM | POA: Diagnosis present

## 2020-08-16 DIAGNOSIS — J9383 Other pneumothorax: Secondary | ICD-10-CM | POA: Diagnosis present

## 2020-08-16 DIAGNOSIS — Z79899 Other long term (current) drug therapy: Secondary | ICD-10-CM | POA: Diagnosis not present

## 2020-08-16 DIAGNOSIS — Z20822 Contact with and (suspected) exposure to covid-19: Secondary | ICD-10-CM | POA: Diagnosis present

## 2020-08-16 DIAGNOSIS — J939 Pneumothorax, unspecified: Secondary | ICD-10-CM | POA: Diagnosis not present

## 2020-08-16 DIAGNOSIS — J9600 Acute respiratory failure, unspecified whether with hypoxia or hypercapnia: Secondary | ICD-10-CM | POA: Diagnosis present

## 2020-08-16 LAB — BASIC METABOLIC PANEL
Anion gap: 7 (ref 5–15)
BUN: 11 mg/dL (ref 6–20)
CO2: 27 mmol/L (ref 22–32)
Calcium: 8.9 mg/dL (ref 8.9–10.3)
Chloride: 106 mmol/L (ref 98–111)
Creatinine, Ser: 0.8 mg/dL (ref 0.44–1.00)
GFR, Estimated: >60 mL/min (ref >60)
Glucose, Bld: 94 mg/dL (ref 70–99)
Potassium: 4.5 mmol/L (ref 3.5–5.1)
Sodium: 140 mmol/L (ref 135–145)

## 2020-08-16 LAB — CBC
HCT: 40.4 % (ref 36.0–46.0)
Hemoglobin: 13.3 g/dL (ref 12.0–15.0)
MCH: 32 pg (ref 26.0–34.0)
MCHC: 32.9 g/dL (ref 30.0–36.0)
MCV: 97.1 fL (ref 80.0–100.0)
Platelets: 137 10*3/uL — ABNORMAL LOW (ref 150–400)
RBC: 4.16 MIL/uL (ref 3.87–5.11)
RDW: 12.1 % (ref 11.5–15.5)
WBC: 5.4 10*3/uL (ref 4.0–10.5)
nRBC: 0 % (ref 0.0–0.2)

## 2020-08-16 LAB — PROTIME-INR
INR: 1 (ref 0.8–1.2)
Prothrombin Time: 12.9 seconds (ref 11.4–15.2)

## 2020-08-16 LAB — HIV ANTIBODY (ROUTINE TESTING W REFLEX): HIV Screen 4th Generation wRfx: NONREACTIVE

## 2020-08-16 MED ORDER — MIDAZOLAM HCL 2 MG/2ML IJ SOLN
INTRAMUSCULAR | Status: AC
  Filled 2020-08-16: qty 2

## 2020-08-16 MED ORDER — LIDOCAINE HCL 1 % IJ SOLN
INTRAMUSCULAR | Status: AC
  Filled 2020-08-16: qty 20

## 2020-08-16 MED ORDER — MIDAZOLAM HCL 2 MG/2ML IJ SOLN
INTRAMUSCULAR | Status: AC | PRN
  Administered 2020-08-16: 0.5 mg via INTRAVENOUS
  Administered 2020-08-16: 1 mg via INTRAVENOUS

## 2020-08-16 MED ORDER — NICOTINE 21 MG/24HR TD PT24
21.0000 mg | MEDICATED_PATCH | Freq: Every day | TRANSDERMAL | Status: DC
  Administered 2020-08-16 – 2020-08-18 (×3): 21 mg via TRANSDERMAL
  Filled 2020-08-16 (×3): qty 1

## 2020-08-16 MED ORDER — FENTANYL CITRATE (PF) 100 MCG/2ML IJ SOLN
INTRAMUSCULAR | Status: AC
  Filled 2020-08-16: qty 2

## 2020-08-16 MED ORDER — FENTANYL CITRATE (PF) 100 MCG/2ML IJ SOLN
INTRAMUSCULAR | Status: AC | PRN
  Administered 2020-08-16: 50 ug via INTRAVENOUS
  Administered 2020-08-16: 25 ug via INTRAVENOUS
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
## 2020-08-16 NOTE — ED Notes (Signed)
x1 attempt to give report to 4E awaiting for call back

## 2020-08-16 NOTE — Progress Notes (Signed)
Pt transferred from ED to 4E16. Alert and oriented x 4.No distress on arrival. On 3 LPM O2 NCL. SPO2 100%, denied SOB. Pain on her right chest scale 7/10. Dilaudid given. Pt was able to rest well. Hemodynamics stable.  CHG bath given, call bell within reach. Will monitor.  Filiberto Pinks, RN

## 2020-08-16 NOTE — ED Notes (Signed)
Pt resting comfortably in bed with no acute distress noted.

## 2020-08-16 NOTE — Sedation Documentation (Signed)
On moving from CT table to bed had 10/10 anterion right chest pain sharp. Once placed in a sitting up position pain went down to 7/10. Sp02 99% on room air. BP and HR a little higher than previous. Chest tube fluctuates with resps and no air leak noted. Transferred to 4E16 and report given to Consulting civil engineer. VS on arrival BP 121/73, HR 75, Resps 16 and sp02 97% Room air. Pain right anterior chest down to 5/10, no distress noted.

## 2020-08-16 NOTE — Procedures (Signed)
Interventional Radiology Procedure Note  Procedure: 10.2 fr pigtail placed in right pneumothorax  Indication: Right pneumothorax  Findings: Please refer to procedural dictation for full description.  Complications: None  EBL: < 10 mL  Acquanetta Belling, MD [phone]

## 2020-08-16 NOTE — Progress Notes (Signed)
PROGRESS NOTE    STEPHANIEANN POPESCU  UXN:[id number] DOB: Jun 24, 1985 DOA: 08/15/2020 PCP: Pcp, No   Brief Narrative:  Whitney Little is a 35 y.o. female with history of previous pneumothorax in 2009 which was spontaneous with ongoing tobacco abuse presents to the ER with complaints of right-sided chest pain ongoing for the last 2 days.  Patient states she was playing with her friends and suddenly stood up 2 days ago when she started up with right-sided chest pain.  Denies any productive cough fever or chills. In ED: CXR revealed right apical pneumothorax and had a CT chest done which shows small right-sided pneumothorax.  Cardiothoracic surgeon advised consulting interventional radiology for chest tube placement.  COVID test is negative.  Assessment & Plan:   Principal Problem:   Pneumothorax   Acute right-sided spontaneous pneumothorax, repeat episode.  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 - Tobacco abuse advised about quitting.  DVT prophylaxis: SCDs.  Avoiding anticoagulation in anticipation of procedure. Code Status: Full code. Family Communication: Husband at bedside  Status is: Inpatient  Dispo: The patient is from: Home              Anticipated d/c is to: Home              Anticipated d/c date is: 48-72h              Patient currently not medically stable for discharge  Consultants:   CT surgery, IR  Procedures:   10.2 French pigtail chest tube -08/16/2020  Antimicrobials:  None  Subjective: No acute issues or events overnight, continues to complain of sharp pleuritic chest pain with deep inspiration or movement, somewhat agitated in the setting of perceived delay of care, she remains without intervention since initial evaluation around 3 PM yesterday and states "no one has fed me or fixed this pneumothorax -I do not know why  it is taking so long"  Objective: Vitals:   08/16/20 0100 08/16/20 0300 08/16/20 0443 08/16/20 0727  BP: 95/76 (!) 90/57  (!) 105/59  Pulse: 65 (!) 58  66  Resp: 17 11  15   Temp:    98 F (36.7 C)  TempSrc:    Oral  SpO2: 99% 100%  100%  Weight:   46.1 kg   Height:       No intake or output data in the 24 hours ending 08/16/20 0733 Filed Weights   08/15/20 1818 08/16/20 0443  Weight: 48.1 kg 46.1 kg    Examination:  General:  Pleasantly resting in bed, No acute distress.  Mildly agitated HEENT:  Normocephalic atraumatic.  Sclerae nonicteric, noninjected.  Extraocular movements intact bilaterally. Neck:  Without mass or deformity.  Trachea is midline. Lungs: Diminished breath sounds right greater than left without overt wheeze rhonchi or rales. Heart:  Regular rate and rhythm.  Without murmurs, rubs, or gallops. Abdomen:  Soft, nontender, nondistended.  Without guarding or rebound. Extremities: Without cyanosis, clubbing, edema, or obvious deformity. Vascular:  Dorsalis pedis and posterior tibial pulses palpable bilaterally. Skin:  Warm and dry, no erythema, no ulcerations.   Data Reviewed: I have personally reviewed following labs and imaging studies  CBC: Recent Labs  Lab 08/15/20 1843 08/16/20 0531  WBC 6.6 5.4  HGB 15.0 13.3  HCT 45.3 40.4  MCV 97.2 97.1  PLT 156 137*   Basic Metabolic Panel: Recent Labs  Lab 08/15/20 1843 08/16/20  0531  NA 136 140  K 3.8 4.5  CL 102 106  CO2 28 27  GLUCOSE 107* 94  BUN 11 11  CREATININE 0.80 0.80  CALCIUM 8.9 8.9   GFR: Estimated Creatinine Clearance: 73.5 mL/min (by C-G formula based on SCr of 0.8 mg/dL). Liver Function Tests: Recent Labs  Lab 08/15/20 1843  AST 29  ALT 22  ALKPHOS 59  BILITOT 0.3  PROT 6.7  ALBUMIN 4.3   No results for input(s): LIPASE, AMYLASE in the last 168 hours. No results for input(s): AMMONIA in the last 168 hours. Coagulation Profile: No results for input(s): INR, PROTIME in  the last 168 hours. Cardiac Enzymes: No results for input(s): CKTOTAL, CKMB, CKMBINDEX, TROPONINI in the last 168 hours. BNP (last 3 results) No results for input(s): PROBNP in the last 8760 hours. HbA1C: No results for input(s): HGBA1C in the last 72 hours. CBG: No results for input(s): GLUCAP in the last 168 hours. Lipid Profile: No results for input(s): CHOL, HDL, LDLCALC, TRIG, CHOLHDL, LDLDIRECT in the last 72 hours. Thyroid Function Tests: No results for input(s): TSH, T4TOTAL, FREET4, T3FREE, THYROIDAB in the last 72 hours. Anemia Panel: No results for input(s): VITAMINB12, FOLATE, FERRITIN, TIBC, IRON, RETICCTPCT in the last 72 hours. Sepsis Labs: No results for input(s): PROCALCITON, LATICACIDVEN in the last 168 hours.  Recent Results (from the past 240 hour(s))  Resp Panel by RT-PCR (Flu A&B, Covid) Nasopharyngeal Swab     Status: None   Collection Time: 08/15/20  7:32 PM   Specimen: Nasopharyngeal Swab; Nasopharyngeal(NP) swabs in vial transport medium  Result Value Ref Range Status   SARS Coronavirus 2 by RT PCR NEGATIVE NEGATIVE Final    Comment: (NOTE) SARS-CoV-2 target nucleic acids are NOT DETECTED.  The SARS-CoV-2 RNA is generally detectable in upper respiratory specimens during the acute phase of infection. The lowest concentration of SARS-CoV-2 viral copies this assay can detect is 138 copies/mL. A negative result does not preclude SARS-Cov-2 infection and should not be used as the sole basis for treatment or other patient management decisions. A negative result may occur with  improper specimen collection/handling, submission of specimen other than nasopharyngeal swab, presence of viral mutation(s) within the areas targeted by this assay, and inadequate number of viral copies(<138 copies/mL). A negative result must be combined with clinical observations, patient history, and epidemiological information. The expected result is Negative.  Fact Sheet for  Patients:  BloggerCourse.com  Fact Sheet for Healthcare Providers:  SeriousBroker.it  This test is no t yet approved or cleared by the [place] FDA and  has been authorized for detection and/or diagnosis of SARS-CoV-2 by FDA under an Emergency Use Authorization (EUA). This EUA will remain  in effect (meaning this test can be used) for the duration of the COVID-19 declaration under Section 564(b)(1) of the Act, 21 U.S.C.section 360bbb-3(b)(1), unless the authorization is terminated  or revoked sooner.       Influenza A by PCR NEGATIVE NEGATIVE Final   Influenza B by PCR NEGATIVE NEGATIVE Final    Comment: (NOTE) The Xpert Xpress SARS-CoV-2/FLU/RSV plus assay is intended as an aid in the diagnosis of influenza from Nasopharyngeal swab specimens and should not be used as a sole basis for treatment. Nasal washings and aspirates are unacceptable for Xpert Xpress SARS-CoV-2/FLU/RSV testing.  Fact Sheet for Patients: BloggerCourse.com  Fact Sheet for Healthcare Providers: SeriousBroker.it  This test is not yet approved or cleared by the [place] FDA and has been authorized for detection  and/or diagnosis of SARS-CoV-2 by FDA under an Emergency Use Authorization (EUA). This EUA will remain in effect (meaning this test can be used) for the duration of the COVID-19 declaration under Section 564(b)(1) of the Act, 21 U.S.C. section 360bbb-3(b)(1), unless the authorization is terminated or revoked.  Performed at [facility], [address] [place], [place] [address]          Radiology Studies: DG Chest 2 View  Addendum Date: 08/15/2020   ADDENDUM REPORT: 08/15/2020 17:07 ADDENDUM: Critical Value/emergent results were called by telephone at the time of interpretation on 08/15/2020 at 5:07 pm to provider ERIN RASPET , who verbally acknowledged these results.  Electronically Signed   By: Signa Kell M.D.   On: 08/15/2020 17:07   Result Date: 08/15/2020 CLINICAL DATA:  Right rib pain. EXAM: CHEST - 2 VIEW COMPARISON:  04/25/10 FINDINGS: Right apical pneumothorax measures 3.2 cm in thickness (approximately 15-20%.). Heart size normal. No pleural effusion or edema. No airspace consolidation identified. Visualized osseous structures are unremarkable. IMPRESSION: Acute right apical pneumothorax measures approximately 15-20%. Electronically Signed: By: Signa Kell M.D. On: 08/15/2020 16:46   CT Chest Wo Contrast  Result Date: 08/15/2020 CLINICAL DATA:  Spontaneous pneumothorax. EXAM: CT CHEST WITHOUT CONTRAST TECHNIQUE: Multidetector CT imaging of the chest was performed following the standard protocol without IV contrast. COMPARISON:  Chest x-ray 08/15/2020 FINDINGS: Cardiovascular: The heart is normal in size. No pericardial effusion. The aorta is normal in caliber. No atherosclerotic calcifications. Mediastinum/Nodes: No mediastinal or hilar mass or adenopathy. The esophagus is grossly normal. Lungs/Pleura: There is a small right-sided pneumothorax as demonstrated on the radiographs. A few tiny apical blebs are suspected. There is a patchy infiltrate noted in the right lower lobe medially. There is also right basilar atelectasis. The left lung is unremarkable. Upper Abdomen: No significant upper abdominal findings. Musculoskeletal: No significant bony findings. IMPRESSION: 1. Small right-sided pneumothorax.  Right apical blebs are noted. 2. Patchy right lower lobe infiltrate. 3. Right basilar atelectasis. Electronically Signed   By: Rudie Meyer M.D.   On: 08/15/2020 19:52    Scheduled Meds: . ferrous sulfate  325 mg Oral Q breakfast  . prenatal multivitamin  1 tablet Oral QHS   Continuous Infusions:   LOS: 0 days   Time spent:  Azucena Fallen, DO [facility]  If 7PM-7AM, please contact  night-coverage [email or web address]  08/16/2020, 7:33 AM

## 2020-08-17 ENCOUNTER — Inpatient Hospital Stay (HOSPITAL_COMMUNITY): Payer: Medicaid Other

## 2020-08-17 MED ORDER — DIPHENHYDRAMINE HCL 25 MG PO CAPS
25.0000 mg | ORAL_CAPSULE | Freq: Once | ORAL | Status: AC
  Administered 2020-08-17: 25 mg via ORAL
  Filled 2020-08-17: qty 1

## 2020-08-17 NOTE — Progress Notes (Addendum)
25 mg benadryl PO x1 \ per MD verbal order.   Lawson Radar, RN

## 2020-08-17 NOTE — Progress Notes (Signed)
Referring Physician(s): Arvilla Market  Supervising Physician: Richarda Overlie  Patient Status:  [facility] - In-pt  Chief Complaint:  Spontaneous right pneumothorax  Subjective: Pt currently without sig c/o; ambulating in room; specifically denies CP, worsening dyspnea, cough; asking when she can go home to see family   Allergies: Patient has no known allergies.  Medications: Prior to Admission medications   Medication Sig Start Date End Date Taking? Authorizing Provider  ferrous sulfate 325 (65 FE) MG tablet Take 325 mg by mouth daily with breakfast.   Yes [provider]  ibuprofen (ADVIL) 200 MG tablet Take 600 mg by mouth every 6 (six) hours as needed for mild pain.   Yes [provider]  Prenatal Vit-Fe Fumarate-FA (MULTIVITAMIN-PRENATAL) 27-0.8 MG TABS Take 1 tablet by mouth at bedtime.    Yes [provider]  acetaminophen (TYLENOL) 325 MG tablet Take 2 tablets (650 mg total) by mouth every 4 (four) hours as needed (for pain scale < 4). Patient not taking: No sig reported 02/01/16   Huel Cote, MD  doxycycline (VIBRAMYCIN) 100 MG capsule Single dose, take 2 now Patient not taking: No sig reported 10/08/19   Eustace Moore, MD     Vital Signs: BP 113/62 (BP Location: Right Arm)   Pulse 68   Temp 98.6 F (37 C) (Oral)   Resp 12   Ht 5\' 4"  (1.626 m)   Wt 101 lb 9.6 oz (46.1 kg)   SpO2 98%   BMI 17.44 kg/m   Physical Exam awake/alert; rt chest tube intact , connected to pleuravac/wall suction, no obvious air leak, small amount yellow fluid in pleuravac  Imaging: DG Chest 2 View  Addendum Date: 08/15/2020   ADDENDUM REPORT: 08/15/2020 17:07 ADDENDUM: Critical Value/emergent results were called by telephone at the time of interpretation on 08/15/2020 at 5:07 pm to provider ERIN RASPET , who verbally acknowledged these results. Electronically Signed   By: 08/17/2020 M.D.   On: 08/15/2020 17:07   Result Date: 08/15/2020 CLINICAL DATA:   Right rib pain. EXAM: CHEST - 2 VIEW COMPARISON:  04/25/10 FINDINGS: Right apical pneumothorax measures 3.2 cm in thickness (approximately 15-20%.). Heart size normal. No pleural effusion or edema. No airspace consolidation identified. Visualized osseous structures are unremarkable. IMPRESSION: Acute right apical pneumothorax measures approximately 15-20%. Electronically Signed: By: 04/27/10 M.D. On: 08/15/2020 16:46   CT Chest Wo Contrast  Result Date: 08/15/2020 CLINICAL DATA:  Spontaneous pneumothorax. EXAM: CT CHEST WITHOUT CONTRAST TECHNIQUE: Multidetector CT imaging of the chest was performed following the standard protocol without IV contrast. COMPARISON:  Chest x-ray 08/15/2020 FINDINGS: Cardiovascular: The heart is normal in size. No pericardial effusion. The aorta is normal in caliber. No atherosclerotic calcifications. Mediastinum/Nodes: No mediastinal or hilar mass or adenopathy. The esophagus is grossly normal. Lungs/Pleura: There is a small right-sided pneumothorax as demonstrated on the radiographs. A few tiny apical blebs are suspected. There is a patchy infiltrate noted in the right lower lobe medially. There is also right basilar atelectasis. The left lung is unremarkable. Upper Abdomen: No significant upper abdominal findings. Musculoskeletal: No significant bony findings. IMPRESSION: 1. Small right-sided pneumothorax.  Right apical blebs are noted. 2. Patchy right lower lobe infiltrate. 3. Right basilar atelectasis. Electronically Signed   By: 08/17/2020 M.D.   On: 08/15/2020 19:52   DG Chest Port 1 View  Result Date: 08/16/2020 CLINICAL DATA:  Spontaneous pneumothorax.  Chest and back pain. EXAM: PORTABLE CHEST 1 VIEW COMPARISON:  CT 08/15/2020.  Chest x-ray FINDINGS: Mediastinum hilar structures normal. Heart size normal. Stable small right apical pneumothorax again noted. Previously identified infiltrate medial right lung base best identified by prior CT. Mild thoracic spine  scoliosis. No acute bony abnormality. IMPRESSION: 1.  Stable small right apical pneumothorax. 2. Previously identified mild infiltrate medial right lung base best identified by prior CT. Electronically Signed   By: Maisie Fus  Register   On: 08/16/2020 08:29   CT PERC PLEURAL DRAIN W/INDWELL CATH W/IMG GUIDE  Result Date: 08/16/2020 INDICATION: 35 year old woman with spontaneous pneumothorax presents to interventional radiology for chest tube placement. EXAM: CT-guided right chest tube placement MEDICATIONS: The patient is currently admitted to the hospital and receiving intravenous antibiotics. The antibiotics were administered within an appropriate time frame prior to the initiation of the procedure. ANESTHESIA/SEDATION: Fentanyl 75 mcg IV; Versed 1.5 mg IV Moderate Sedation Time:  24 minutes The patient was continuously monitored during the procedure by the interventional radiology nurse under my direct supervision. COMPLICATIONS: None immediate. PROCEDURE: Informed written consent was obtained from the patient after a thorough discussion of the procedural risks, benefits and alternatives. All questions were addressed. A timeout was performed prior to the initiation of the procedure. Patient position left lateral decubitus on the CT table. Preliminary CT examination demonstrated small right pneumothorax. The overlying skin was prepped and draped in usual fashion. Following local lidocaine administration, the right pleural space was accessed with a 17 gauge needle utilizing CT guidance. 17 gauge needle exchanged for 10.2 [place] multipurpose pigtail drain over 0.035 inch guidewire. Drain secured to skin with suture and Stay Fix. Drain attached to Pleur-Evac set at -20 cm H2O. IMPRESSION: 10.2 [place] multipurpose pigtail drain placed in right pleural space for treatment of pneumothorax. Electronically Signed   By: Acquanetta Belling M.D.   On: 08/16/2020 15:05    Labs:  CBC: Recent Labs    08/15/20 1843  08/16/20 0531  WBC 6.6 5.4  HGB 15.0 13.3  HCT 45.3 40.4  PLT 156 137*    COAGS: Recent Labs    08/16/20 0731  INR 1.0    BMP: Recent Labs    08/15/20 1843 08/16/20 0531  NA 136 140  K 3.8 4.5  CL 102 106  CO2 28 27  GLUCOSE 107* 94  BUN 11 11  CALCIUM 8.9 8.9  CREATININE 0.80 0.80  GFRNONAA >60 >60    LIVER FUNCTION TESTS: Recent Labs    08/15/20 1843  BILITOT 0.3  AST 29  ALT 22  ALKPHOS 59  PROT 6.7  ALBUMIN 4.3    Assessment and Plan: Pt with hx tobacco use, recurrent spontaneous rt pneumothorax, rt apical blebs noted on CT; s/p rt chest tube placement 4/20; afebrile; O2 sats 98% RA; no new labs, CXR pending this am; await findings of CXR ; cont current tx for now   Electronically Signed: D. Jeananne Rama, PA-C 08/17/2020, 10:00 AM   I spent a total of 15 minutes at the the patient's bedside AND on the patient's hospital floor or unit, greater than 50% of which was counseling/coordinating care for right chest tube    Patient ID: Whitney Little, female   DOB: August 11, 1985, 35 y.o.   MRN: [id number]

## 2020-08-17 NOTE — Plan of Care (Signed)

## 2020-08-17 NOTE — Progress Notes (Signed)
Patient ID: Whitney Little, female   DOB: 07-27-1985, 35 y.o.   MRN: [id number] Pt remains asymptomatic from right chest tube placement; 02 sats are 98% on RA; no obvious air leak on pleuravac; CXR today shows decreased but not fully resolved ptx; case d/w and imaging reviewed by Dr. Lowella Dandy; cont tube to wall suction and plan f/u CXR in am; if no ptx then may try water seal trial followed by repeat imaging. Plans d/w pt.

## 2020-08-17 NOTE — Progress Notes (Signed)
PROGRESS NOTE    Whitney Little  WCH:[id number] DOB: 1985-07-14 DOA: 08/15/2020 PCP: Pcp, No   Brief Narrative:  [name] is a 35 y.o. female with history of previous pneumothorax in 2009 which was spontaneous with ongoing tobacco abuse presents to the ER with complaints of right-sided chest pain ongoing for the last 2 days. Patient states she was playing with her friends and suddenly stood up 2 days ago when she started up with right-sided chest pain. Denies any productive cough fever or chills. In ED: CXR revealed right apical pneumothorax and had a CT chest done which shows small right-sided pneumothorax. Cardiothoracic surgeon advised consulting interventional radiology for chest tube placement. COVID test is negative.  Assessment & Plan:   Principal Problem:   Pneumothorax   Acute right-sided spontaneous pneumothorax, repeat episode.  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  Plan to continue chest tube to suction overnight, clamping trial in the morning with repeat imaging, potential discharge pending further resolution of pneumothorax.  Tobacco abuse - advised about quitting.  DVT prophylaxis: SCDs/early ambulation Code Status: Full code. Family Communication: Friend at bedside  Status is: Inpatient  Dispo: The patient is from: Home              Anticipated d/c is to: Home              Anticipated d/c date is: 24-48h              Patient currently not medically stable for discharge  Consultants:   CT surgery, IR  Procedures:   10.2 French pigtail chest tube -08/16/2020  Antimicrobials:  None  Subjective: No acute issues or events overnight, chest pain resolved, ambulating  without any hypoxia or dyspnea, chest tube remains to suction with small apical pneumothorax ongoing, patient requesting discharge which we discussed would depend on resolution of pneumothorax.  Objective: Vitals:   08/16/20 1613 08/16/20 1953 08/16/20 2306 08/16/20 2308  BP: 116/64 117/68    Pulse: 84 71    Resp: 15 12 (!) 29 18  Temp: 97.9 F (36.6 C) 98.9 F (37.2 C)    TempSrc: Oral Oral    SpO2: 97% 99%    Weight:      Height:        Intake/Output Summary (Last 24 hours) at 08/17/2020 0751 Last data filed at 08/16/2020 2300 Gross per 24 hour  Intake 100 ml  Output --  Net 100 ml   Filed Weights   08/15/20 1818 08/16/20 0443  Weight: 48.1 kg 46.1 kg    Examination:  General:  Pleasantly resting in bed, No acute distress.  Mildly agitated HEENT:  Normocephalic atraumatic.  Sclerae nonicteric, noninjected.  Extraocular movements intact bilaterally. Neck:  Without mass or deformity.  Trachea is midline. Lungs: Clear to auscultate bilaterally without overt wheezes rales or rhonchi Heart:  Regular rate and rhythm.  Without murmurs, rubs, or gallops. Abdomen:  Soft, nontender, nondistended.  Without guarding or rebound. Extremities: Without cyanosis, clubbing, edema, or obvious deformity. Vascular:  Dorsalis pedis and posterior tibial pulses palpable bilaterally. Skin:  Warm and dry, no erythema, no ulcerations.   Data Reviewed: I have personally reviewed following labs and imaging studies  CBC: Recent Labs  Lab 08/15/20 1843 08/16/20 0531  WBC 6.6 5.4  HGB 15.0 13.3  HCT 45.3 40.4  MCV 97.2 97.1  PLT 156 137*   Basic Metabolic Panel: Recent Labs  Lab 08/15/20 1843 08/16/20 0531  NA 136 140  K 3.8 4.5  CL 102 106  CO2 28 27  GLUCOSE 107* 94  BUN 11 11  CREATININE 0.80 0.80  CALCIUM 8.9 8.9   GFR: Estimated Creatinine Clearance: 73.5 mL/min (by C-G formula based on SCr of 0.8 mg/dL). Liver Function Tests: Recent Labs  Lab 08/15/20 1843  AST 29   ALT 22  ALKPHOS 59  BILITOT 0.3  PROT 6.7  ALBUMIN 4.3   No results for input(s): LIPASE, AMYLASE in the last 168 hours. No results for input(s): AMMONIA in the last 168 hours. Coagulation Profile: Recent Labs  Lab 08/16/20 0731  INR 1.0   Cardiac Enzymes: No results for input(s): CKTOTAL, CKMB, CKMBINDEX, TROPONINI in the last 168 hours. BNP (last 3 results) No results for input(s): PROBNP in the last 8760 hours. HbA1C: No results for input(s): HGBA1C in the last 72 hours. CBG: No results for input(s): GLUCAP in the last 168 hours. Lipid Profile: No results for input(s): CHOL, HDL, LDLCALC, TRIG, CHOLHDL, LDLDIRECT in the last 72 hours. Thyroid Function Tests: No results for input(s): TSH, T4TOTAL, FREET4, T3FREE, THYROIDAB in the last 72 hours. Anemia Panel: No results for input(s): VITAMINB12, FOLATE, FERRITIN, TIBC, IRON, RETICCTPCT in the last 72 hours. Sepsis Labs: No results for input(s): PROCALCITON, LATICACIDVEN in the last 168 hours.  Recent Results (from the past 240 hour(s))  Resp Panel by RT-PCR (Flu A&B, Covid) Nasopharyngeal Swab     Status: None   Collection Time: 08/15/20  7:32 PM   Specimen: Nasopharyngeal Swab; Nasopharyngeal(NP) swabs in vial transport medium  Result Value Ref Range Status   SARS Coronavirus 2 by RT PCR NEGATIVE NEGATIVE Final    Comment: (NOTE) SARS-CoV-2 target nucleic acids are NOT DETECTED.  The SARS-CoV-2 RNA is generally detectable in upper respiratory specimens during the acute phase of infection. The lowest concentration of SARS-CoV-2 viral copies this assay can detect is 138 copies/mL. A negative result does not preclude SARS-Cov-2 infection and should not be used as the sole basis for treatment or other patient management decisions. A negative result may occur with  improper specimen collection/handling, submission of specimen other than nasopharyngeal swab, presence of viral mutation(s) within the areas targeted by this  assay, and inadequate number of viral copies(<138 copies/mL). A negative result must be combined with clinical observations, patient history, and epidemiological information. The expected result is Negative.  Fact Sheet for Patients:  BloggerCourse.com  Fact Sheet for Healthcare Providers:  SeriousBroker.it  This test is no t yet approved or cleared by the [place] FDA and  has been authorized for detection and/or diagnosis of SARS-CoV-2 by FDA under an Emergency Use Authorization (EUA). This EUA will remain  in effect (meaning this test can be used) for the duration of the COVID-19 declaration under Section 564(b)(1) of the Act, 21 U.S.C.section 360bbb-3(b)(1), unless the authorization is terminated  or revoked sooner.       Influenza A by PCR NEGATIVE NEGATIVE Final   Influenza B by PCR NEGATIVE NEGATIVE Final    Comment: (NOTE) The Xpert Xpress SARS-CoV-2/FLU/RSV plus assay is intended as an aid in the diagnosis of influenza from Nasopharyngeal swab specimens and should not be used as a sole basis for treatment. Nasal washings and aspirates are unacceptable for Xpert Xpress SARS-CoV-2/FLU/RSV testing.  Fact  Sheet for Patients: BloggerCourse.com  Fact Sheet for Healthcare Providers: SeriousBroker.it  This test is not yet approved or cleared by the [place] FDA and has been authorized for detection and/or diagnosis of SARS-CoV-2 by FDA under an Emergency Use Authorization (EUA). This EUA will remain in effect (meaning this test can be used) for the duration of the COVID-19 declaration under Section 564(b)(1) of the Act, 21 U.S.C. section 360bbb-3(b)(1), unless the authorization is terminated or revoked.  Performed at [facility], [address] [place], [place] [address]      Radiology Studies: DG Chest 2 View  Addendum Date: 08/15/2020   ADDENDUM  REPORT: 08/15/2020 17:07 ADDENDUM: Critical Value/emergent results were called by telephone at the time of interpretation on 08/15/2020 at 5:07 pm to provider ERIN RASPET , who verbally acknowledged these results. Electronically Signed   By: Signa Kell M.D.   On: 08/15/2020 17:07   Result Date: 08/15/2020 CLINICAL DATA:  Right rib pain. EXAM: CHEST - 2 VIEW COMPARISON:  04/25/10 FINDINGS: Right apical pneumothorax measures 3.2 cm in thickness (approximately 15-20%.). Heart size normal. No pleural effusion or edema. No airspace consolidation identified. Visualized osseous structures are unremarkable. IMPRESSION: Acute right apical pneumothorax measures approximately 15-20%. Electronically Signed: By: Signa Kell M.D. On: 08/15/2020 16:46   CT Chest Wo Contrast  Result Date: 08/15/2020 CLINICAL DATA:  Spontaneous pneumothorax. EXAM: CT CHEST WITHOUT CONTRAST TECHNIQUE: Multidetector CT imaging of the chest was performed following the standard protocol without IV contrast. COMPARISON:  Chest x-ray 08/15/2020 FINDINGS: Cardiovascular: The heart is normal in size. No pericardial effusion. The aorta is normal in caliber. No atherosclerotic calcifications. Mediastinum/Nodes: No mediastinal or hilar mass or adenopathy. The esophagus is grossly normal. Lungs/Pleura: There is a small right-sided pneumothorax as demonstrated on the radiographs. A few tiny apical blebs are suspected. There is a patchy infiltrate noted in the right lower lobe medially. There is also right basilar atelectasis. The left lung is unremarkable. Upper Abdomen: No significant upper abdominal findings. Musculoskeletal: No significant bony findings. IMPRESSION: 1. Small right-sided pneumothorax.  Right apical blebs are noted. 2. Patchy right lower lobe infiltrate. 3. Right basilar atelectasis. Electronically Signed   By: Rudie Meyer M.D.   On: 08/15/2020 19:52   DG Chest Port 1 View  Result Date: 08/16/2020 CLINICAL DATA:  Spontaneous  pneumothorax.  Chest and back pain. EXAM: PORTABLE CHEST 1 VIEW COMPARISON:  CT 08/15/2020.  Chest x-ray FINDINGS: Mediastinum hilar structures normal. Heart size normal. Stable small right apical pneumothorax again noted. Previously identified infiltrate medial right lung base best identified by prior CT. Mild thoracic spine scoliosis. No acute bony abnormality. IMPRESSION: 1.  Stable small right apical pneumothorax. 2. Previously identified mild infiltrate medial right lung base best identified by prior CT. Electronically Signed   By: Maisie Fus  Register   On: 08/16/2020 08:29   CT PERC PLEURAL DRAIN W/INDWELL CATH W/IMG GUIDE  Result Date: 08/16/2020 INDICATION: 35 year old woman with spontaneous pneumothorax presents to interventional radiology for chest tube placement. EXAM: CT-guided right chest tube placement MEDICATIONS: The patient is currently admitted to the hospital and receiving intravenous antibiotics. The antibiotics were administered within an appropriate time frame prior to the initiation of the procedure. ANESTHESIA/SEDATION: Fentanyl 75 mcg IV; Versed 1.5 mg IV Moderate Sedation Time:  24 minutes The patient was continuously monitored during the procedure by the interventional radiology nurse under my direct supervision. COMPLICATIONS: None immediate. PROCEDURE: Informed written consent was obtained from the patient after a thorough discussion  of the procedural risks, benefits and alternatives. All questions were addressed. A timeout was performed prior to the initiation of the procedure. Patient position left lateral decubitus on the CT table. Preliminary CT examination demonstrated small right pneumothorax. The overlying skin was prepped and draped in usual fashion. Following local lidocaine administration, the right pleural space was accessed with a 17 gauge needle utilizing CT guidance. 17 gauge needle exchanged for 10.2 [place] multipurpose pigtail drain over 0.035 inch guidewire. Drain  secured to skin with suture and Stay Fix. Drain attached to Pleur-Evac set at -20 cm H2O. IMPRESSION: 10.2 [place] multipurpose pigtail drain placed in right pleural space for treatment of pneumothorax. Electronically Signed   By: Acquanetta BellingFarhaan  Mir M.D.   On: 08/16/2020 15:05    Scheduled Meds: . ferrous sulfate  325 mg Oral Q breakfast  . nicotine  21 mg Transdermal Daily  . prenatal multivitamin  1 tablet Oral QHS   Continuous Infusions:   LOS: 1 day   Time spent: 30min  Azucena FallenWilliam C Renn Dirocco, DO [facility]  If 7PM-7AM, please contact night-coverage [email or web address]  08/17/2020, 7:51 AM

## 2020-08-18 ENCOUNTER — Inpatient Hospital Stay (HOSPITAL_COMMUNITY): Payer: Medicaid Other

## 2020-08-18 LAB — CBC
HCT: 43.4 % (ref 36.0–46.0)
Hemoglobin: 14.4 g/dL (ref 12.0–15.0)
MCH: 32 pg (ref 26.0–34.0)
MCHC: 33.2 g/dL (ref 30.0–36.0)
MCV: 96.4 fL (ref 80.0–100.0)
Platelets: 152 10*3/uL (ref 150–400)
RBC: 4.5 MIL/uL (ref 3.87–5.11)
RDW: 12 % (ref 11.5–15.5)
WBC: 6.1 10*3/uL (ref 4.0–10.5)
nRBC: 0 % (ref 0.0–0.2)

## 2020-08-18 LAB — BASIC METABOLIC PANEL
Anion gap: 5 (ref 5–15)
BUN: 13 mg/dL (ref 6–20)
CO2: 30 mmol/L (ref 22–32)
Calcium: 9.4 mg/dL (ref 8.9–10.3)
Chloride: 106 mmol/L (ref 98–111)
Creatinine, Ser: 0.85 mg/dL (ref 0.44–1.00)
GFR, Estimated: >60 mL/min (ref >60)
Glucose, Bld: 92 mg/dL (ref 70–99)
Potassium: 4.8 mmol/L (ref 3.5–5.1)
Sodium: 141 mmol/L (ref 135–145)

## 2020-08-18 MED ORDER — HYDROCODONE-ACETAMINOPHEN 5-325 MG PO TABS: 1.0000 | ORAL_TABLET | ORAL | 0 refills | Status: DC | PRN

## 2020-08-18 NOTE — Plan of Care (Signed)
  Problem: Health Behavior/Discharge Planning: Goal: Ability to manage health-related needs will improve Outcome: Progressing   

## 2020-08-18 NOTE — Progress Notes (Signed)
Referring Physician(s): Arvilla Market  Supervising Physician: Dr. Elby Showers  Patient Status:  [name] - In-pt  Chief Complaint:  Spontaneous right pneumothorax  Subjective: Pt currently without sig c/o; ambulating in room; specifically denies CP Feels good, no SOB, has been working with IS. CXR this am stable, Tube was placed to water seal at 0900. Repeat CXR at 3pm today reviewed with Dr. Lowella Dandy.  Allergies: Patient has no known allergies.  Medications:  Current Facility-Administered Medications:  .  acetaminophen (TYLENOL) tablet 650 mg, 650 mg, Oral, Q6H PRN, 650 mg at 08/16/20 1425 **OR** acetaminophen (TYLENOL) suppository 650 mg, 650 mg, Rectal, Q6H PRN, Eduard Clos, MD .  ferrous sulfate tablet 325 mg, 325 mg, Oral, Q breakfast, Eduard Clos, MD, 325 mg at 08/18/20 0933 .  HYDROmorphone (DILAUDID) injection 1 mg, 1 mg, Intravenous, Q3H PRN, Eduard Clos, MD, 1 mg at 08/17/20 2226 .  nicotine (NICODERM CQ - dosed in mg/24 hours) patch 21 mg, 21 mg, Transdermal, Daily, Azucena Fallen, MD, 21 mg at 08/18/20 0934 .  prenatal multivitamin tablet 1 tablet, 1 tablet, Oral, QHS, Eduard Clos, MD, 1 tablet at 08/17/20 2057    Vital Signs: BP 118/79 (BP Location: Right Arm)   Pulse 88   Temp 97.8 F (36.6 C) (Oral)   Resp 19   Ht 5\' 4"  (1.626 m)   Wt 46.1 kg   SpO2 98%   BMI 17.44 kg/m   Physical Exam awake/alert; rt chest tube intact. No air leak. Disconnected from suction  Imaging: DG Chest 1 View  Result Date: 08/18/2020 CLINICAL DATA:  History of a right pneumothorax. Chest tube in place. EXAM: CHEST  1 VIEW COMPARISON:  Single-view of the chest earlier today. FINDINGS: Pigtail catheter in the lower right chest is again seen. Small right apical pneumothorax has slightly increased in size. The left lung is expanded and clear. Heart size is normal. No pleural fluid. IMPRESSION: Slight increase in a small right pneumothorax with a  chest tube in place. Electronically Signed   By: 08/20/2020 M.D.   On: 08/18/2020 15:58   DG Chest 2 View  Addendum Date: 08/15/2020   ADDENDUM REPORT: 08/15/2020 17:07 ADDENDUM: Critical Value/emergent results were called by telephone at the time of interpretation on 08/15/2020 at 5:07 pm to provider ERIN RASPET , who verbally acknowledged these results. Electronically Signed   By: 08/17/2020 M.D.   On: 08/15/2020 17:07   Result Date: 08/15/2020 CLINICAL DATA:  Right rib pain. EXAM: CHEST - 2 VIEW COMPARISON:  04/25/10 FINDINGS: Right apical pneumothorax measures 3.2 cm in thickness (approximately 15-20%.). Heart size normal. No pleural effusion or edema. No airspace consolidation identified. Visualized osseous structures are unremarkable. IMPRESSION: Acute right apical pneumothorax measures approximately 15-20%. Electronically Signed: By: 04/27/10 M.D. On: 08/15/2020 16:46   CT Chest Wo Contrast  Result Date: 08/15/2020 CLINICAL DATA:  Spontaneous pneumothorax. EXAM: CT CHEST WITHOUT CONTRAST TECHNIQUE: Multidetector CT imaging of the chest was performed following the standard protocol without IV contrast. COMPARISON:  Chest x-ray 08/15/2020 FINDINGS: Cardiovascular: The heart is normal in size. No pericardial effusion. The aorta is normal in caliber. No atherosclerotic calcifications. Mediastinum/Nodes: No mediastinal or hilar mass or adenopathy. The esophagus is grossly normal. Lungs/Pleura: There is a small right-sided pneumothorax as demonstrated on the radiographs. A few tiny apical blebs are suspected. There is a patchy infiltrate noted in the right lower lobe medially. There is also right basilar atelectasis. The left lung is  unremarkable. Upper Abdomen: No significant upper abdominal findings. Musculoskeletal: No significant bony findings. IMPRESSION: 1. Small right-sided pneumothorax.  Right apical blebs are noted. 2. Patchy right lower lobe infiltrate. 3. Right basilar  atelectasis. Electronically Signed   By: Rudie Meyer M.D.   On: 08/15/2020 19:52   DG Chest Port 1 View  Result Date: 08/18/2020 CLINICAL DATA:  Right chest tube. EXAM: PORTABLE CHEST 1 VIEW COMPARISON:  August 17, 2020. FINDINGS: The heart size and mediastinal contours are within normal limits. Left lung is clear. Stable position of right basilar chest tube. Stable minimal right apical pneumothorax is noted. The visualized skeletal structures are unremarkable. IMPRESSION: Stable position of right-sided chest tube with stable minimal right apical pneumothorax. Electronically Signed   By: Lupita Raider M.D.   On: 08/18/2020 08:40   DG Chest Port 1 View  Result Date: 08/17/2020 CLINICAL DATA:  Follow-up right pneumothorax. EXAM: PORTABLE CHEST 1 VIEW COMPARISON:  08/16/2020 FINDINGS: Interval placement of right basilar chest tube with decrease volume of the previously noted right apical pneumothorax. Heart size normal. No pleural effusion or edema. No airspace densities. IMPRESSION: Decrease in volume of right apical pneumothorax status post chest tube placement. Electronically Signed   By: Signa Kell M.D.   On: 08/17/2020 11:51   DG Chest Port 1 View  Result Date: 08/16/2020 CLINICAL DATA:  Spontaneous pneumothorax.  Chest and back pain. EXAM: PORTABLE CHEST 1 VIEW COMPARISON:  CT 08/15/2020.  Chest x-ray FINDINGS: Mediastinum hilar structures normal. Heart size normal. Stable small right apical pneumothorax again noted. Previously identified infiltrate medial right lung base best identified by prior CT. Mild thoracic spine scoliosis. No acute bony abnormality. IMPRESSION: 1.  Stable small right apical pneumothorax. 2. Previously identified mild infiltrate medial right lung base best identified by prior CT. Electronically Signed   By: Maisie Fus  Register   On: 08/16/2020 08:29   CT PERC PLEURAL DRAIN W/INDWELL CATH W/IMG GUIDE  Result Date: 08/16/2020 INDICATION: 35 year old woman with spontaneous  pneumothorax presents to interventional radiology for chest tube placement. EXAM: CT-guided right chest tube placement MEDICATIONS: The patient is currently admitted to the hospital and receiving intravenous antibiotics. The antibiotics were administered within an appropriate time frame prior to the initiation of the procedure. ANESTHESIA/SEDATION: Fentanyl 75 mcg IV; Versed 1.5 mg IV Moderate Sedation Time:  24 minutes The patient was continuously monitored during the procedure by the interventional radiology nurse under my direct supervision. COMPLICATIONS: None immediate. PROCEDURE: Informed written consent was obtained from the patient after a thorough discussion of the procedural risks, benefits and alternatives. All questions were addressed. A timeout was performed prior to the initiation of the procedure. Patient position left lateral decubitus on the CT table. Preliminary CT examination demonstrated small right pneumothorax. The overlying skin was prepped and draped in usual fashion. Following local lidocaine administration, the right pleural space was accessed with a 17 gauge needle utilizing CT guidance. 17 gauge needle exchanged for 10.2 [place] multipurpose pigtail drain over 0.035 inch guidewire. Drain secured to skin with suture and Stay Fix. Drain attached to Pleur-Evac set at -20 cm H2O. IMPRESSION: 10.2 [place] multipurpose pigtail drain placed in right pleural space for treatment of pneumothorax. Electronically Signed   By: Acquanetta Belling M.D.   On: 08/16/2020 15:05    Labs:  CBC: Recent Labs    08/15/20 1843 08/16/20 0531 08/18/20 0504  WBC 6.6 5.4 6.1  HGB 15.0 13.3 14.4  HCT 45.3 40.4 43.4  PLT 156 137* 152  COAGS: Recent Labs    08/16/20 0731  INR 1.0    BMP: Recent Labs    08/15/20 1843 08/16/20 0531 08/18/20 0504  NA 136 140 141  K 3.8 4.5 4.8  CL 102 106 106  CO2 28 27 30   GLUCOSE 107* 94 92  BUN 11 11 13   CALCIUM 8.9 8.9 9.4  CREATININE 0.80 0.80 0.85   GFRNONAA >60 >60 >60    LIVER FUNCTION TESTS: Recent Labs    08/15/20 1843  BILITOT 0.3  AST 29  ALT 22  ALKPHOS 59  PROT 6.7  ALBUMIN 4.3    Assessment and Plan: Recurrent spontaneous rt pneumothorax, rt apical blebs noted on CT;  s/p rt chest tube placement 4/20 CXR after 6 hours water seal, shows small apical PTX remains stable. Pt asymptomatic. Tube was removed at bedside without complications. Pt feels fine post removal, no SOB or pain. Repeat CXR ordered. If repeat CXR stable, can discharge tonight. Reviewed instructions including IS use, avoid lifting, strenuous activity. Pt states she is able to work from home next week. Ideally should have Pulmonary or CTS follow up as outpt.   Electronically Signed: 08/17/20, PA-C 08/18/2020, 4:49 PM   I spent a total of 15 minutes at the the patient's bedside AND on the patient's hospital floor or unit, greater than 50% of which was counseling/coordinating care for right chest tube    Patient ID: Whitney Little, female   DOB: 08/29/1985, 35 y.o.   MRN: [birth year]

## 2020-08-18 NOTE — Progress Notes (Signed)
Chest tube placed in water seal per PA from radiology order.   Lawson Radar, RN

## 2020-08-18 NOTE — Discharge Summary (Signed)
Physician Discharge Summary  Whitney Little ZOX:[id number] DOB: [birth year] DOA: 08/15/2020  PCP: Pcp, No  Admit date: 08/15/2020 Discharge date: 08/18/2020  Admitted From: Home Disposition: Home  Recommendations for Outpatient Follow-up:  1. Follow up with PCP in 1-2 weeks 2. Please obtain BMP/CBC in one week 3. Please follow up with repeat imaging as scheduled  Home Health: None Equipment/Devices: None  Discharge Condition: Stable CODE STATUS: Full Diet recommendation: Regular diet as tolerated  Brief/Interim Summary: Whitney L Boggsis a 35 y.o.femalewithhistory of previous pneumothorax in 2009 which was spontaneous with ongoing tobacco abuse presents to the ER with complaints of right-sided chest pain ongoing for the last 2 days.Patient states she was playing with her friends and suddenly stood up 2 days ago when she started up with right-sided chest pain. Denies any productive cough fever or chills. In ED: CXR revealed right apical pneumothorax and had a CT chest done which shows small right-sided pneumothorax. Cardiothoracic surgeon advised consulting interventional radiology for chest tube placement. COVID test is negative.   Patient as above with acute respiratory failure and chest pain, pleuritic in nature, in the setting of right sided pneumothorax.  Interestingly this is her second pneumothorax, initial pneumothorax was 2009 that also required chest tube placement but ultimately resolved with supportive care.  Patient had pigtail catheter placed at our facility with marked improvement in pneumothorax over the past 48 hours.  At this time chest x-ray does show minimal apical pneumothorax that appears to be stable after chest tube removal.  Patient otherwise stable and agreeable for discharge home, we discussed that should she have recurrent episode, worsening chest pain shortness of breath or dyspnea with exertion or fatigue she should immediately report back to the ED given  concern for recurrent pneumothorax given her history.  We also discussed that should she have a recurrent pneumothorax we would likely encourage pleurodesis/similar to prevent any further issues or events.  We also discussed her tobacco abuse at length at bedside, she has done well with nicotine patches in the past, recommending PCP evaluation for Chantix/similar aid in hopes to help patient discontinue cigarette use.  Discharge Diagnoses:  Principal Problem:   Pneumothorax    Discharge Instructions  Discharge Instructions    Call MD for:  difficulty breathing, headache or visual disturbances   Complete by: As directed    Call MD for:  severe uncontrolled pain   Complete by: As directed    Diet - low sodium heart healthy   Complete by: As directed    Increase activity slowly   Complete by: As directed    No wound care   Complete by: As directed      Allergies as of 08/18/2020   No Known Allergies     Medication List    STOP taking these medications   doxycycline 100 MG capsule Commonly known as: VIBRAMYCIN     TAKE these medications   acetaminophen 325 MG tablet Commonly known as: Tylenol Take 2 tablets (650 mg total) by mouth every 4 (four) hours as needed (for pain scale < 4).   ferrous sulfate 325 (65 FE) MG tablet Take 325 mg by mouth daily with breakfast.   HYDROcodone-acetaminophen 5-325 MG tablet Commonly known as: NORCO/VICODIN Take 1 tablet by mouth every 4 (four) hours as needed for moderate pain.   ibuprofen 200 MG tablet Commonly known as: ADVIL Take 600 mg by mouth every 6 (six) hours as needed for mild pain.   multivitamin-prenatal 27-0.8 MG Tabs tablet  Take 1 tablet by mouth at bedtime.       No Known Allergies  Consultations: Interventional radiology  Procedures/Studies: DG Chest 1 View  Addendum Date: 08/18/2020   ADDENDUM REPORT: 08/18/2020 19:29 ADDENDUM: Please disregard the prior report. This is the proper report for this exam.  CLINICAL DATA:  Right pneumothorax, chest tube removal. EXAM: Chest one view COMPARISON:  Radiograph earlier today. FINDINGS: Two images obtained. Removal of right-sided chest tube. Initial image demonstrates a lucency paralleling the right upper lateral chest wall, that is not seen on repeat imaging, and may be external to the patient. Possible small right apical pneumothorax visualized between the posterior second and third rib, similar in size to earlier today. No progression. No basilar component after chest tube removal. Exam is otherwise unchanged. Left lung is clear. Normal heart size and mediastinal contours. IMPRESSION: 1. Removal of right basilar pigtail catheter. Suspected small right apical pneumothorax that is unchanged in size from earlier today. No progression. No basilar component after pigtail catheter removal. 2. Lucency paralleling the right upper lateral chest wall on initial images felt to be external to the patient. Discussed with the referring provider at 7:20 p.m. Electronically Signed   By: Narda Rutherford M.D.   On: 08/18/2020 19:29   Result Date: 08/18/2020 CLINICAL DATA:  Right pneumothorax.  Removal of chest tube. EXAM: CHEST  1 VIEW COMPARISON:  None. FINDINGS: The heart size and mediastinal contours are within normal limits. Both lungs are clear. The visualized skeletal structures are unremarkable. IMPRESSION: No active disease. Electronically Signed: By: Narda Rutherford M.D. On: 08/18/2020 18:17   DG Chest 1 View  Result Date: 08/18/2020 CLINICAL DATA:  History of a right pneumothorax. Chest tube in place. EXAM: CHEST  1 VIEW COMPARISON:  Single-view of the chest earlier today. FINDINGS: Pigtail catheter in the lower right chest is again seen. Small right apical pneumothorax has slightly increased in size. The left lung is expanded and clear. Heart size is normal. No pleural fluid. IMPRESSION: Slight increase in a small right pneumothorax with a chest tube in place.  Electronically Signed   By: Drusilla Kanner M.D.   On: 08/18/2020 15:58   DG Chest 2 View  Addendum Date: 08/15/2020   ADDENDUM REPORT: 08/15/2020 17:07 ADDENDUM: Critical Value/emergent results were called by telephone at the time of interpretation on 08/15/2020 at 5:07 pm to provider ERIN RASPET , who verbally acknowledged these results. Electronically Signed   By: Signa Kell M.D.   On: 08/15/2020 17:07   Result Date: 08/15/2020 CLINICAL DATA:  Right rib pain. EXAM: CHEST - 2 VIEW COMPARISON:  04/25/10 FINDINGS: Right apical pneumothorax measures 3.2 cm in thickness (approximately 15-20%.). Heart size normal. No pleural effusion or edema. No airspace consolidation identified. Visualized osseous structures are unremarkable. IMPRESSION: Acute right apical pneumothorax measures approximately 15-20%. Electronically Signed: By: Signa Kell M.D. On: 08/15/2020 16:46   CT Chest Wo Contrast  Result Date: 08/15/2020 CLINICAL DATA:  Spontaneous pneumothorax. EXAM: CT CHEST WITHOUT CONTRAST TECHNIQUE: Multidetector CT imaging of the chest was performed following the standard protocol without IV contrast. COMPARISON:  Chest x-ray 08/15/2020 FINDINGS: Cardiovascular: The heart is normal in size. No pericardial effusion. The aorta is normal in caliber. No atherosclerotic calcifications. Mediastinum/Nodes: No mediastinal or hilar mass or adenopathy. The esophagus is grossly normal. Lungs/Pleura: There is a small right-sided pneumothorax as demonstrated on the radiographs. A few tiny apical blebs are suspected. There is a patchy infiltrate noted in the right lower lobe medially.  There is also right basilar atelectasis. The left lung is unremarkable. Upper Abdomen: No significant upper abdominal findings. Musculoskeletal: No significant bony findings. IMPRESSION: 1. Small right-sided pneumothorax.  Right apical blebs are noted. 2. Patchy right lower lobe infiltrate. 3. Right basilar atelectasis. Electronically  Signed   By: Rudie MeyerP.  Gallerani M.D.   On: 08/15/2020 19:52   DG Chest Port 1 View  Result Date: 08/18/2020 CLINICAL DATA:  Right chest tube. EXAM: PORTABLE CHEST 1 VIEW COMPARISON:  August 17, 2020. FINDINGS: The heart size and mediastinal contours are within normal limits. Left lung is clear. Stable position of right basilar chest tube. Stable minimal right apical pneumothorax is noted. The visualized skeletal structures are unremarkable. IMPRESSION: Stable position of right-sided chest tube with stable minimal right apical pneumothorax. Electronically Signed   By: Lupita RaiderJames  Green Jr M.D.   On: 08/18/2020 08:40   DG Chest Port 1 View  Result Date: 08/17/2020 CLINICAL DATA:  Follow-up right pneumothorax. EXAM: PORTABLE CHEST 1 VIEW COMPARISON:  08/16/2020 FINDINGS: Interval placement of right basilar chest tube with decrease volume of the previously noted right apical pneumothorax. Heart size normal. No pleural effusion or edema. No airspace densities. IMPRESSION: Decrease in volume of right apical pneumothorax status post chest tube placement. Electronically Signed   By: Signa Kellaylor  Stroud M.D.   On: 08/17/2020 11:51   DG Chest Port 1 View  Result Date: 08/16/2020 CLINICAL DATA:  Spontaneous pneumothorax.  Chest and back pain. EXAM: PORTABLE CHEST 1 VIEW COMPARISON:  CT 08/15/2020.  Chest x-ray FINDINGS: Mediastinum hilar structures normal. Heart size normal. Stable small right apical pneumothorax again noted. Previously identified infiltrate medial right lung base best identified by prior CT. Mild thoracic spine scoliosis. No acute bony abnormality. IMPRESSION: 1.  Stable small right apical pneumothorax. 2. Previously identified mild infiltrate medial right lung base best identified by prior CT. Electronically Signed   By: Maisie Fushomas  Register   On: 08/16/2020 08:29   CT PERC PLEURAL DRAIN W/INDWELL CATH W/IMG GUIDE  Result Date: 08/16/2020 INDICATION: 35 year old woman with spontaneous pneumothorax presents to  interventional radiology for chest tube placement. EXAM: CT-guided right chest tube placement MEDICATIONS: The patient is currently admitted to the hospital and receiving intravenous antibiotics. The antibiotics were administered within an appropriate time frame prior to the initiation of the procedure. ANESTHESIA/SEDATION: Fentanyl 75 mcg IV; Versed 1.5 mg IV Moderate Sedation Time:  24 minutes The patient was continuously monitored during the procedure by the interventional radiology nurse under my direct supervision. COMPLICATIONS: None immediate. PROCEDURE: Informed written consent was obtained from the patient after a thorough discussion of the procedural risks, benefits and alternatives. All questions were addressed. A timeout was performed prior to the initiation of the procedure. Patient position left lateral decubitus on the CT table. Preliminary CT examination demonstrated small right pneumothorax. The overlying skin was prepped and draped in usual fashion. Following local lidocaine administration, the right pleural space was accessed with a 17 gauge needle utilizing CT guidance. 17 gauge needle exchanged for 10.2 [place] multipurpose pigtail drain over 0.035 inch guidewire. Drain secured to skin with suture and Stay Fix. Drain attached to Pleur-Evac set at -20 cm H2O. IMPRESSION: 10.2 [place] multipurpose pigtail drain placed in right pleural space for treatment of pneumothorax. Electronically Signed   By: Acquanetta BellingFarhaan  Mir M.D.   On: 08/16/2020 15:05     Subjective: No acute issues or events overnight tolerated chest tube clamping without complication, requesting discharge home.   Discharge Exam: Vitals:   08/18/20  0732 08/18/20 1224  BP: 117/68 118/79  Pulse: 67 88  Resp: 18 19  Temp: 97.9 F (36.6 C) 97.8 F (36.6 C)  SpO2: 100% 98%   Vitals:   08/17/20 2349 08/18/20 0346 08/18/20 0732 08/18/20 1224  BP: 115/70 111/74 117/68 118/79  Pulse: 67 68 67 88  Resp: Temp: 98.3 F  (36.8 C) 98.1 F (36.7 C) 97.9 F (36.6 C) 97.8 F (36.6 C)  TempSrc: Oral Oral Oral Oral  SpO2: 99% 99% 100% 98%  Weight:      Height:        General: Pt is alert, awake, not in acute distress Cardiovascular: RRR, S1/S2 +, no rubs, no gallops Respiratory: CTA bilaterally, no wheezing, no rhonchi Abdominal: Soft, NT, ND, bowel sounds + Extremities: no edema, no cyanosis    The results of significant diagnostics from this hospitalization (including imaging, microbiology, ancillary and laboratory) are listed below for reference.     Microbiology: Recent Results (from the past 240 hour(s))  Resp Panel by RT-PCR (Flu A&B, Covid) Nasopharyngeal Swab     Status: None   Collection Time: 08/15/20  7:32 PM   Specimen: Nasopharyngeal Swab; Nasopharyngeal(NP) swabs in vial transport medium  Result Value Ref Range Status   SARS Coronavirus 2 by RT PCR NEGATIVE NEGATIVE Final    Comment: (NOTE) SARS-CoV-2 target nucleic acids are NOT DETECTED.  The SARS-CoV-2 RNA is generally detectable in upper respiratory specimens during the acute phase of infection. The lowest concentration of SARS-CoV-2 viral copies this assay can detect is 138 copies/mL. A negative result does not preclude SARS-Cov-2 infection and should not be used as the sole basis for treatment or other patient management decisions. A negative result may occur with  improper specimen collection/handling, submission of specimen other than nasopharyngeal swab, presence of viral mutation(s) within the areas targeted by this assay, and inadequate number of viral copies(<138 copies/mL). A negative result must be combined with clinical observations, patient history, and epidemiological information. The expected result is Negative.  Fact Sheet for Patients:  BloggerCourse.com  Fact Sheet for Healthcare Providers:  SeriousBroker.it  This test is no t yet approved or cleared by  the [place] FDA and  has been authorized for detection and/or diagnosis of SARS-CoV-2 by FDA under an Emergency Use Authorization (EUA). This EUA will remain  in effect (meaning this test can be used) for the duration of the COVID-19 declaration under Section 564(b)(1) of the Act, 21 U.S.C.section 360bbb-3(b)(1), unless the authorization is terminated  or revoked sooner.       Influenza A by PCR NEGATIVE NEGATIVE Final   Influenza B by PCR NEGATIVE NEGATIVE Final    Comment: (NOTE) The Xpert Xpress SARS-CoV-2/FLU/RSV plus assay is intended as an aid in the diagnosis of influenza from Nasopharyngeal swab specimens and should not be used as a sole basis for treatment. Nasal washings and aspirates are unacceptable for Xpert Xpress SARS-CoV-2/FLU/RSV testing.  Fact Sheet for Patients: BloggerCourse.com  Fact Sheet for Healthcare Providers: SeriousBroker.it  This test is not yet approved or cleared by the [place] FDA and has been authorized for detection and/or diagnosis of SARS-CoV-2 by FDA under an Emergency Use Authorization (EUA). This EUA will remain in effect (meaning this test can be used) for the duration of the COVID-19 declaration under Section 564(b)(1) of the Act, 21 U.S.C. section 360bbb-3(b)(1), unless the authorization is terminated or revoked.  Performed at [facility], [address], [place], [place]  [address]      Labs: BNP (last 3 results) No results for input(s): BNP in the last 8760 hours. Basic Metabolic Panel: Recent Labs  Lab 08/15/20 1843 08/16/20 0531 08/18/20 0504  NA 136 140 141  K 3.8 4.5 4.8  CL 102 106 106  CO2 28 27 30   GLUCOSE 107* 94 92  BUN 11 11 13   CREATININE 0.80 0.80 0.85  CALCIUM 8.9 8.9 9.4   Liver Function Tests: Recent Labs  Lab 08/15/20 1843  AST 29  ALT 22  ALKPHOS 59  BILITOT 0.3  PROT 6.7  ALBUMIN 4.3   No results for input(s): LIPASE,  AMYLASE in the last 168 hours. No results for input(s): AMMONIA in the last 168 hours. CBC: Recent Labs  Lab 08/15/20 1843 08/16/20 0531 08/18/20 0504  WBC 6.6 5.4 6.1  HGB 15.0 13.3 14.4  HCT 45.3 40.4 43.4  MCV 97.2 97.1 96.4  PLT 156 137* 152   Cardiac Enzymes: No results for input(s): CKTOTAL, CKMB, CKMBINDEX, TROPONINI in the last 168 hours. BNP: Invalid input(s): POCBNP CBG: No results for input(s): GLUCAP in the last 168 hours. D-Dimer No results for input(s): DDIMER in the last 72 hours. Hgb A1c No results for input(s): HGBA1C in the last 72 hours. Lipid Profile No results for input(s): CHOL, HDL, LDLCALC, TRIG, CHOLHDL, LDLDIRECT in the last 72 hours. Thyroid function studies No results for input(s): TSH, T4TOTAL, T3FREE, THYROIDAB in the last 72 hours.  Invalid input(s): FREET3 Anemia work up No results for input(s): VITAMINB12, FOLATE, FERRITIN, TIBC, IRON, RETICCTPCT in the last 72 hours. Urinalysis No results found for: COLORURINE, APPEARANCEUR, LABSPEC, PHURINE, GLUCOSEU, HGBUR, BILIRUBINUR, KETONESUR, PROTEINUR, UROBILINOGEN, NITRITE, LEUKOCYTESUR Sepsis Labs Invalid input(s): PROCALCITONIN,  WBC,  LACTICIDVEN Microbiology Recent Results (from the past 240 hour(s))  Resp Panel by RT-PCR (Flu A&B, Covid) Nasopharyngeal Swab     Status: None   Collection Time: 08/15/20  7:32 PM   Specimen: Nasopharyngeal Swab; Nasopharyngeal(NP) swabs in vial transport medium  Result Value Ref Range Status   SARS Coronavirus 2 by RT PCR NEGATIVE NEGATIVE Final    Comment: (NOTE) SARS-CoV-2 target nucleic acids are NOT DETECTED.  The SARS-CoV-2 RNA is generally detectable in upper respiratory specimens during the acute phase of infection. The lowest concentration of SARS-CoV-2 viral copies this assay can detect is 138 copies/mL. A negative result does not preclude SARS-Cov-2 infection and should not be used as the sole basis for treatment or other patient management  decisions. A negative result may occur with  improper specimen collection/handling, submission of specimen other than nasopharyngeal swab, presence of viral mutation(s) within the areas targeted by this assay, and inadequate number of viral copies(<138 copies/mL). A negative result must be combined with clinical observations, patient history, and epidemiological information. The expected result is Negative.  Fact Sheet for Patients:  08/20/20  Fact Sheet for Healthcare Providers:  08/17/20  This test is no t yet approved or cleared by the [employer] FDA and  has been authorized for detection and/or diagnosis of SARS-CoV-2 by FDA under an Emergency Use Authorization (EUA). This EUA will remain  in effect (meaning this test can be used) for the duration of the COVID-19 declaration under Section 564(b)(1) of the Act, 21 U.S.C.section 360bbb-3(b)(1), unless the authorization is terminated  or revoked sooner.       Influenza A by PCR NEGATIVE NEGATIVE Final   Influenza B by PCR NEGATIVE NEGATIVE Final    Comment: (NOTE) The Xpert Xpress SARS-CoV-2/FLU/RSV plus  assay is intended as an aid in the diagnosis of influenza from Nasopharyngeal swab specimens and should not be used as a sole basis for treatment. Nasal washings and aspirates are unacceptable for Xpert Xpress SARS-CoV-2/FLU/RSV testing.  Fact Sheet for Patients: BloggerCourse.com  Fact Sheet for Healthcare Providers: SeriousBroker.it  This test is not yet approved or cleared by the [place] FDA and has been authorized for detection and/or diagnosis of SARS-CoV-2 by FDA under an Emergency Use Authorization (EUA). This EUA will remain in effect (meaning this test can be used) for the duration of the COVID-19 declaration under Section 564(b)(1) of the Act, 21 U.S.C. section 360bbb-3(b)(1), unless the  authorization is terminated or revoked.  Performed at [facility], [address], [place], [place] [address]      Time coordinating discharge: Over 30 minutes  SIGNED:   Azucena Fallen, DO [facility] 08/18/2020, 7:32 PM Pager   If 7PM-7AM, please contact night-coverage [email or web address]

## 2020-11-02 ENCOUNTER — Other Ambulatory Visit: Payer: Self-pay

## 2020-11-02 ENCOUNTER — Ambulatory Visit (HOSPITAL_COMMUNITY)
Admission: EM | Admit: 2020-11-02 | Discharge: 2020-11-02 | Disposition: A | Discharge status: Home / Self Care | Payer: Medicaid Other | Attending: Student | Admitting: Student

## 2020-11-02 ENCOUNTER — Encounter (HOSPITAL_COMMUNITY): Payer: Self-pay

## 2020-11-02 DIAGNOSIS — N3001 Acute cystitis with hematuria: Secondary | ICD-10-CM | POA: Insufficient documentation

## 2020-11-02 LAB — POCT URINALYSIS DIPSTICK, ED / UC
Bilirubin Urine: NEGATIVE
Glucose, UA: NEGATIVE mg/dL
Ketones, ur: NEGATIVE mg/dL
Nitrite: NEGATIVE
Protein, ur: NEGATIVE mg/dL
Specific Gravity, Urine: 1.015 (ref 1.005–1.030)
Urobilinogen, UA: 0.2 mg/dL (ref 0.0–1.0)
pH: 7.5 (ref 5.0–8.0)

## 2020-11-02 MED ORDER — CEPHALEXIN 500 MG PO CAPS: 500.0000 mg | ORAL_CAPSULE | Freq: Four times a day (QID) | ORAL | 0 refills | Status: DC

## 2020-11-02 NOTE — ED Triage Notes (Signed)
35 year old female presents today with complaints of lower abdominal pain that started on Tuesday. Patient states the following symptoms: dysuria, polyuria. Patient states she does have Hx UTI.   Patient denies: fever, hematuria, back pain.
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
## 2020-11-02 NOTE — Discharge Instructions (Addendum)
-  We are treating you for a urinary traction infection with an antibiotic called Keflex.  Take this 4 times daily for 5 days.  You can take this with food if you have a sensitive stomach. -Drink plenty of fluids to flush your kidneys. -We are also testing for yeast and bacterial vaginosis.  These are both caused by an abnormal pH, and are not a sexually transmitted disease.  We will call you if this is positive and can send additional treatment at that time. Verlon Au also sending a urine culture to confirm the diagnosis of UTI. -Seek additional medical attention if you develop new symptoms like worsening abdominal pain, back pain, new fever/chills.

## 2020-11-03 ENCOUNTER — Telehealth (HOSPITAL_COMMUNITY): Payer: Self-pay | Admitting: Emergency Medicine

## 2020-11-03 LAB — CERVICOVAGINAL ANCILLARY ONLY
Bacterial Vaginitis (gardnerella): NEGATIVE
Candida Glabrata: NEGATIVE
Candida Vaginitis: POSITIVE — AB
Comment: NEGATIVE
Comment: NEGATIVE
Comment: NEGATIVE

## 2020-11-03 MED ORDER — FLUCONAZOLE 150 MG PO TABS: 150.0000 mg | ORAL_TABLET | Freq: Once | ORAL | 0 refills | Status: DC

## 2020-11-03 MED ORDER — FLUCONAZOLE 150 MG PO TABS: 150.0000 mg | ORAL_TABLET | Freq: Once | ORAL | 0 refills | Status: AC

## 2020-11-05 LAB — URINE CULTURE: Culture: 50000 — AB

## 2022-03-07 ENCOUNTER — Ambulatory Visit
Admission: EM | Admit: 2022-03-07 | Discharge: 2022-03-07 | Disposition: A | Discharge status: Home / Self Care | Payer: Medicaid Other | Attending: Family Medicine | Admitting: Family Medicine

## 2022-03-07 ENCOUNTER — Telehealth: Payer: Self-pay

## 2022-03-07 ENCOUNTER — Encounter: Payer: Self-pay | Admitting: Emergency Medicine

## 2022-03-07 DIAGNOSIS — J309 Allergic rhinitis, unspecified: Secondary | ICD-10-CM | POA: Diagnosis not present

## 2022-03-07 DIAGNOSIS — Z1152 Encounter for screening for COVID-19: Secondary | ICD-10-CM | POA: Diagnosis not present

## 2022-03-07 DIAGNOSIS — R509 Fever, unspecified: Secondary | ICD-10-CM | POA: Insufficient documentation

## 2022-03-07 DIAGNOSIS — R071 Chest pain on breathing: Secondary | ICD-10-CM | POA: Insufficient documentation

## 2022-03-07 DIAGNOSIS — R059 Cough, unspecified: Secondary | ICD-10-CM | POA: Insufficient documentation

## 2022-03-07 LAB — RESP PANEL BY RT-PCR (FLU A&B, COVID) ARPGX2
Influenza A by PCR: NEGATIVE
Influenza B by PCR: NEGATIVE
SARS Coronavirus 2 by RT PCR: NEGATIVE

## 2022-03-07 MED ORDER — PREDNISONE 50 MG PO TABS: ORAL_TABLET | ORAL | 0 refills | Status: DC

## 2022-03-07 MED ORDER — ACETAMINOPHEN 325 MG PO TABS
650.0000 mg | ORAL_TABLET | ORAL | Status: AC
  Administered 2022-03-07: 650 mg via ORAL

## 2022-03-07 MED ORDER — PROMETHAZINE-DM 6.25-15 MG/5ML PO SYRP: 5.0000 mL | ORAL_SOLUTION | Freq: Two times a day (BID) | ORAL | 0 refills | Status: DC | PRN

## 2022-03-07 MED ORDER — FEXOFENADINE HCL 180 MG PO TABS: 180.0000 mg | ORAL_TABLET | Freq: Every day | ORAL | 0 refills | Status: DC

## 2022-03-07 MED ORDER — AZITHROMYCIN 250 MG PO TABS: 250.0000 mg | ORAL_TABLET | Freq: Every day | ORAL | 0 refills | Status: DC

## 2022-03-07 NOTE — ED Triage Notes (Signed)
Cough x 3 days  Daughter was sick  with a virus  Pt states her chest hurts when she breathes  Tmax at home 100F - took tylenol 1gm  Negative home COVID test last night  Runny nose last nigght No OTC cough or cold meds

## 2022-03-07 NOTE — Telephone Encounter (Signed)
Pt called to get lab results. Informed results are neg. Continue to take meds as prescribed. F.u with PCP early next week if sxs worsen or unresolved.
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
## 2022-03-07 NOTE — Discharge Instructions (Addendum)
Instructed patient to take medications as directed with food to completion.  Advised patient to take prednisone and Allegra with Zithromax daily for the next 5 days.  May use Allegra as needed afterwards for concurrent postnasal drainage/drip advised patient may use promethazine DM at night prior to sleep for cough due to sedative effects.  Advised patient may take OTC Tylenol 1000 to 4000 mg daily as needed for fever.  Encouraged increased daily water intake while taking these medications.  Advised we will follow-up with COVID-19/influenza labs once received.

## 2022-03-08 ENCOUNTER — Telehealth: Payer: Self-pay | Admitting: Emergency Medicine

## 2022-03-08 NOTE — Telephone Encounter (Signed)
Spoke with patient states that she has taken the medication as instructed.  Still having some difficulty with taken a deep breath, not as bad as yesterday, better today.  Patient will continue to monitor.  Advised patient that if she is continuing to feel like she's having problems with taken a deep breath, to follow up here or with her PCP today.  Patient voices understanding.

## 2022-10-16 ENCOUNTER — Ambulatory Visit
Admission: EM | Admit: 2022-10-16 | Discharge: 2022-10-16 | Disposition: A | Discharge status: Home / Self Care | Payer: Medicaid Other | Attending: Family Medicine | Admitting: Family Medicine

## 2022-10-16 DIAGNOSIS — J069 Acute upper respiratory infection, unspecified: Secondary | ICD-10-CM | POA: Diagnosis not present

## 2022-10-16 DIAGNOSIS — R059 Cough, unspecified: Secondary | ICD-10-CM

## 2022-10-16 DIAGNOSIS — J309 Allergic rhinitis, unspecified: Secondary | ICD-10-CM

## 2022-10-16 MED ORDER — BENZONATATE 200 MG PO CAPS: 200.0000 mg | ORAL_CAPSULE | Freq: Three times a day (TID) | ORAL | 0 refills | Status: DC | PRN

## 2022-10-16 MED ORDER — PROMETHAZINE-DM 6.25-15 MG/5ML PO SYRP: 5.0000 mL | ORAL_SOLUTION | Freq: Two times a day (BID) | ORAL | 0 refills | Status: DC | PRN

## 2022-10-16 MED ORDER — PREDNISONE 20 MG PO TABS: ORAL_TABLET | ORAL | 0 refills | Status: DC

## 2022-10-16 MED ORDER — AZITHROMYCIN 250 MG PO TABS: 250.0000 mg | ORAL_TABLET | Freq: Every day | ORAL | 0 refills | Status: DC
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
## 2022-10-16 NOTE — ED Triage Notes (Signed)
Pt c/o nasal congestion and chest tightness x 2 days. Intermittent cough. Says it feels like "an elephant is sitting on her chest". Says she was seen 6 mos ago for similar sxs. Tx w/ zpak, allegra and prednisone.

## 2022-10-16 NOTE — Discharge Instructions (Addendum)
Advised patient to take medications as directed with food to completion.  Advised patient to take prednisone with Zithromax daily for the next 5 days.  Advised may take Tessalon Perles daily or as needed for cough.  Advised may use Promethazine DM at night for cough due to sedative effects.  Encouraged increase daily water intake to 64 ounces per day while taking these medications.  Advised if symptoms worsen and/or unresolved please follow-up with PCP or here for further evaluation.

## 2022-10-22 ENCOUNTER — Ambulatory Visit
Admission: EM | Admit: 2022-10-22 | Discharge: 2022-10-22 | Disposition: A | Discharge status: Home / Self Care | Payer: Medicaid Other | Attending: Family Medicine | Admitting: Family Medicine

## 2022-10-22 DIAGNOSIS — K219 Gastro-esophageal reflux disease without esophagitis: Secondary | ICD-10-CM

## 2022-10-22 DIAGNOSIS — R1013 Epigastric pain: Secondary | ICD-10-CM | POA: Diagnosis not present

## 2022-10-22 MED ORDER — OMEPRAZOLE 40 MG PO CPDR: 40.0000 mg | DELAYED_RELEASE_CAPSULE | Freq: Every day | ORAL | 0 refills | Status: DC
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
## 2022-10-22 NOTE — ED Triage Notes (Addendum)
Pt c/o what she thinks is heartburn. Says it kept her up all night. Some dry heaving/vomiting. Pepcid early this am. No relief. No personal hx of GERD, but father has it.

## 2022-10-22 NOTE — Discharge Instructions (Addendum)
Advised patient to take medication as directed.  Advised patient to take medication with 12 ounces of water on empty stomach first thing early in the morning and no other medications including smoking, other beverages or foods for 45 minutes to an hour to allow proton pump inhibitor to work effectively and your body.  Encouraged increase daily water intake to 64 ounces per day while taking this medication.  Advised if symptoms worsen and/or unresolved please follow-up with PCP or here for further evaluation.

## 2022-12-19 ENCOUNTER — Ambulatory Visit: Payer: Medicaid Other

## 2022-12-19 ENCOUNTER — Encounter: Payer: Self-pay | Admitting: *Deleted

## 2022-12-19 ENCOUNTER — Other Ambulatory Visit: Payer: Self-pay

## 2022-12-19 ENCOUNTER — Ambulatory Visit
Admission: EM | Admit: 2022-12-19 | Discharge: 2022-12-19 | Disposition: A | Discharge status: Home / Self Care | Payer: Medicaid Other | Attending: Urgent Care | Admitting: Urgent Care

## 2022-12-19 DIAGNOSIS — M25522 Pain in left elbow: Secondary | ICD-10-CM | POA: Diagnosis not present

## 2022-12-19 DIAGNOSIS — L03114 Cellulitis of left upper limb: Secondary | ICD-10-CM | POA: Diagnosis not present

## 2022-12-19 MED ORDER — ACETAMINOPHEN 500 MG PO TABS
1000.0000 mg | ORAL_TABLET | Freq: Once | ORAL | Status: AC
  Administered 2022-12-19: 1000 mg via ORAL

## 2022-12-19 MED ORDER — COLCHICINE 0.6 MG PO TABS: ORAL_TABLET | ORAL | 0 refills | Status: DC

## 2022-12-19 MED ORDER — PREDNISONE 10 MG (21) PO TBPK: ORAL_TABLET | Freq: Every day | ORAL | 0 refills | Status: DC

## 2022-12-19 MED ORDER — CEPHALEXIN 500 MG PO CAPS: 500.0000 mg | ORAL_CAPSULE | Freq: Four times a day (QID) | ORAL | 0 refills | Status: AC

## 2022-12-19 NOTE — Discharge Instructions (Addendum)
I suspect your symptoms are related to acute gout, but would also like to cover for a skin infection. I will you once the xray has been read if there are any additional findings not discussed with you today.  Please take the prednisone as prescribed, this is best taken first thing in the morning with breakfast. Taking at night can cause insomnia. Start with 6 pills at the same time day one, and take one pill less than the previous day until gone. Take 2 tabs of colchicine this evening, followed by one tab one hour later. Take the keflex every 6 hours for 5 days.  You should notice a significant improvement in the next 48 hours. If your pain does not improve, or if you develop worsening symptoms or a fever, please head to the ER.

## 2022-12-19 NOTE — ED Triage Notes (Signed)
C/o pain and swelling in left elbow. Denies known injury. Elbow with area of redness and swelling noted
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
## 2023-04-10 ENCOUNTER — Ambulatory Visit
Admission: EM | Admit: 2023-04-10 | Discharge: 2023-04-10 | Disposition: A | Discharge status: Home / Self Care | Payer: Medicaid Other | Attending: Family Medicine | Admitting: Family Medicine

## 2023-04-10 ENCOUNTER — Other Ambulatory Visit: Payer: Self-pay

## 2023-04-10 DIAGNOSIS — U071 COVID-19: Secondary | ICD-10-CM | POA: Diagnosis not present

## 2023-04-10 LAB — POCT INFLUENZA A/B
Influenza A, POC: NEGATIVE
Influenza B, POC: NEGATIVE

## 2023-04-10 LAB — POC SARS CORONAVIRUS 2 AG -  ED: SARS Coronavirus 2 Ag: POSITIVE — AB

## 2023-04-10 MED ORDER — PAXLOVID (300/100) 20 X 150 MG & 10 X 100MG PO TBPK: 3.0000 | ORAL_TABLET | Freq: Two times a day (BID) | ORAL | 0 refills | Status: AC
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
## 2023-04-10 NOTE — Discharge Instructions (Signed)
Take Paxlovid 2 times a day for 5 days Get plenty of rest.  Drink lots of fluids Take over-the-counter medicines for pain and fever Call for problems

## 2023-04-10 NOTE — ED Triage Notes (Addendum)
Since Monday has had cough, congestion, chest discomfort, feeling cold. Feels like it is moving into her chest. Has been taking mucinex, nasal decongestant.

## 2023-04-12 ENCOUNTER — Encounter: Payer: Self-pay | Admitting: Family Medicine

## 2023-04-12 ENCOUNTER — Other Ambulatory Visit: Payer: Self-pay

## 2023-04-12 ENCOUNTER — Ambulatory Visit
Admission: EM | Admit: 2023-04-12 | Discharge: 2023-04-12 | Disposition: A | Discharge status: Home / Self Care | Payer: Medicaid Other | Attending: Family Medicine | Admitting: Family Medicine

## 2023-04-12 DIAGNOSIS — R059 Cough, unspecified: Secondary | ICD-10-CM

## 2023-04-12 DIAGNOSIS — J0100 Acute maxillary sinusitis, unspecified: Secondary | ICD-10-CM | POA: Diagnosis not present

## 2023-04-12 MED ORDER — AMOXICILLIN-POT CLAVULANATE 875-125 MG PO TABS: 1.0000 | ORAL_TABLET | Freq: Two times a day (BID) | ORAL | 0 refills | Status: AC

## 2023-04-12 MED ORDER — BENZONATATE 200 MG PO CAPS: 200.0000 mg | ORAL_CAPSULE | Freq: Three times a day (TID) | ORAL | 0 refills | Status: AC | PRN

## 2023-04-12 MED ORDER — HYDROCODONE BIT-HOMATROP MBR 5-1.5 MG/5ML PO SOLN: 5.0000 mL | Freq: Four times a day (QID) | ORAL | 0 refills | Status: AC | PRN

## 2023-04-12 MED ORDER — PREDNISONE 10 MG (21) PO TBPK: ORAL_TABLET | Freq: Every day | ORAL | 0 refills | Status: AC

## 2023-04-12 NOTE — Discharge Instructions (Addendum)
Advised patient to take medications as directed with food to completion.  Advised patient to take prednisone with first dose of Augmentin until complete.  Advised may use Tessalon capsules daily or as needed for cough.  Advised may use Hycodan cough syrup at night prior to sleep for cough due to sedative effects.  Encouraged to increase daily water intake to 64 ounces per day while taking these medications.  Advised if symptoms worsen and/or unresolved please follow-up with PCP or here for further evaluation.
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
## 2023-04-12 NOTE — ED Triage Notes (Addendum)
Pt c/o nasal congestion since Mon. Was seen on Thurs. Tested pos for COVID. States she thought she has a sinus infection. Claims Dr Delton See never came into room to evaluate her for a sinus infection which she has a hx of. No improvement since starting Paxlovid. Also taking mucinex and OTC decongestant with no relief.
